# Patient Record
Sex: Male | Born: 1940 | Race: White | Hispanic: No | Marital: Married | Smoking: Former smoker
Health system: Southern US, Community
[De-identification: ages and names within clinical notes are randomized; demographics above are authoritative.]

## PROBLEM LIST (undated history)

## (undated) DIAGNOSIS — M1990 Unspecified osteoarthritis, unspecified site: Secondary | ICD-10-CM

## (undated) DIAGNOSIS — I6529 Occlusion and stenosis of unspecified carotid artery: Secondary | ICD-10-CM

## (undated) DIAGNOSIS — J449 Chronic obstructive pulmonary disease, unspecified: Secondary | ICD-10-CM

## (undated) DIAGNOSIS — I10 Essential (primary) hypertension: Secondary | ICD-10-CM

## (undated) DIAGNOSIS — I509 Heart failure, unspecified: Secondary | ICD-10-CM

## (undated) DIAGNOSIS — N200 Calculus of kidney: Secondary | ICD-10-CM

## (undated) HISTORY — DX: Unspecified osteoarthritis, unspecified site: M19.90

## (undated) HISTORY — PX: EYE SURGERY: SHX253

## (undated) HISTORY — DX: Occlusion and stenosis of unspecified carotid artery: I65.29

## (undated) HISTORY — DX: Calculus of kidney: N20.0

## (undated) HISTORY — PX: KIDNEY STONE SURGERY: SHX686

## (undated) HISTORY — DX: Essential (primary) hypertension: I10

## (undated) HISTORY — DX: Heart failure, unspecified: I50.9

---

## 1997-03-15 HISTORY — PX: CHOLECYSTECTOMY: SHX55

## 2010-08-08 HISTORY — PX: ROTATOR CUFF REPAIR: SHX139

## 2011-02-11 ENCOUNTER — Encounter (INDEPENDENT_AMBULATORY_CARE_PROVIDER_SITE_OTHER): Payer: Medicare PPO | Admitting: Vascular Surgery

## 2011-02-11 DIAGNOSIS — I6529 Occlusion and stenosis of unspecified carotid artery: Secondary | ICD-10-CM

## 2011-02-12 NOTE — Consult Note (Signed)
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  He is a very pleasant 70 year old gentleman who noted tingling in both hands and this is relatively constant.  He does not have any other neurologic deficits specifically no amaurosis fugax, transient ischemic attack or strokes.  He does not have any history of heart disease.  He underwent a carotid duplex evaluation and this revealed moderate to severe left carotid stenosis and no significant right carotid stenosis.  I am seeing him for further discussion.  He does have a history of insulin dependent diabetes and hypertension.  PAST SURGICAL HISTORY:  Significant for cholecystectomy, cataract surgery, rotator cuff surgery and kidney stone extraction.  SOCIAL HISTORY:  He is married with 1 child.  He  does not smoke, having quit in 1963.  He does not drink alcohol on a regular basis.  FAMILY HISTORY:  Significant for hypertension in his parents, congestive heart failure in a brother.  REVIEW OF SYSTEMS:  No weight loss or gain.  He weighs 200 pounds.  He is 5 feet 8 inches tall.  He does have pain over his pretibial area with walking.  CARDIAC:  Negative. GI:  Negative. NEUROLOGIC:  Negative. PULMONARY:  Positive for productive cough. GENITOURINARY:  Positive for  kidney stones. HEMATOLOGIC:  Negative. ENT:  Negative. MUSCULOSKELETAL:  Positive for arthritis, joint pain and muscle pain. PSYCHIATRIC:  Negative. SKIN:  Negative.  PHYSICAL EXAMINATION:  General:  Well developed, well nourished  white male appearing stated age of 79 in no acute distress.  Blood pressure is 165/85, pulse 81, respirations 18.  HEENT:  Normal.  Chest:  Clear bilateral without rales, rhonchi or wheezes.  Heart:  Regular rate and rhythm without murmur.  Abdomen:   Soft, nontender, moderately obese. Musculoskeletal:  Shows no major deformity or cyanosis.  Neurologic:  No focal weakness or  paresthesias.  Skin:  Without ulcers or rashes.  His carotid arteries do not have bruits bilaterally.  He has 2+ radial and 2+ posterior pulses bilaterally.  I do have his review from [facility] suggesting left internal carotid in the 50% to 69% stenosis range.  I discussed this at length with the patient and his wife present.  I have explained symptoms of carotid disease.  He knows to notify us should this occur, otherwise, we will see him in 6 months with repeat carotid duplex.    Larina Earthly, M.D. Electronically Signed  TFE/MEDQ  D:  02/11/2011  T:  02/12/2011  Job:  [id number]  cc:   Lia Hopping

## 2011-08-11 ENCOUNTER — Encounter: Payer: Self-pay | Admitting: Vascular Surgery

## 2011-08-12 ENCOUNTER — Ambulatory Visit (INDEPENDENT_AMBULATORY_CARE_PROVIDER_SITE_OTHER): Payer: Medicare PPO | Admitting: Vascular Surgery

## 2011-08-12 ENCOUNTER — Ambulatory Visit (INDEPENDENT_AMBULATORY_CARE_PROVIDER_SITE_OTHER): Payer: Medicare PPO

## 2011-08-12 ENCOUNTER — Encounter: Payer: Self-pay | Admitting: Vascular Surgery

## 2011-08-12 VITALS — BP 138/85 | HR 68 | Temp 97.8°F | Wt 203.0 lb

## 2011-08-12 DIAGNOSIS — I6529 Occlusion and stenosis of unspecified carotid artery: Secondary | ICD-10-CM

## 2011-08-19 NOTE — Procedures (Unsigned)
CAROTID DUPLEX EXAM  INDICATION:  Carotid stenosis.  HISTORY: Diabetes:  Yes. Cardiac:  No. Hypertension:  Yes. Smoking:  Quit in 1964. Previous Surgery:  No. CV History:  Asymptomatic. Amaurosis Fugax No, Paresthesias No, Hemiparesis No.                                      RIGHT             LEFT Brachial systolic pressure:         144               140 Brachial Doppler waveforms:         Normal            Normal Vertebral direction of flow:        Antegrade         Antegrade DUPLEX VELOCITIES (cm/sec) CCA peak systolic                   59                65 ECA peak systolic                   79                64 ICA peak systolic                   71                110 ICA end diastolic                   30                42 PLAQUE MORPHOLOGY:                  Mixed             Mixed PLAQUE AMOUNT:                      Minimal           Minimal PLAQUE LOCATION:                    Bifurcation       CCA, ICA, ECA  IMPRESSION: 1. Right internal carotid artery velocities suggest 1% to 39%     stenosis. 2. Left internal carotid artery velocities suggest 40% to 59%     stenosis. 3. Antegrade vertebral arteries bilaterally.  ___________________________________________ Larina Earthly, M.D.  EM/MEDQ  D:  08/12/2011  T:  08/12/2011  Job:  [id number]

## 2011-10-20 DIAGNOSIS — I6529 Occlusion and stenosis of unspecified carotid artery: Secondary | ICD-10-CM | POA: Insufficient documentation

## 2011-12-23 ENCOUNTER — Encounter: Payer: Self-pay | Admitting: Vascular Surgery

## 2011-12-23 NOTE — Progress Notes (Signed)
The patient presents on 08/12/2011 for evaluation of asymptomatic carotid disease. He had initially had a carotid duplex at [facility] ingesting a 50-70% left internal carotid artery stenosis. He had no symptoms related to carotid disease. He is seen today for followup. He does have history of diabetes and hypertension quit smoking in 1964 does not have any amaurosis fugax transient ischemic attack or stroke. Past Medical History  Diagnosis Date  . Diabetes mellitus   . Hypertension   . Kidney stone   . Arthritis   . Carotid artery occlusion     History  Substance Use Topics  . Smoking status: Former Smoker    Quit date: 12/15/1961  . Smokeless tobacco: Not on file  . Alcohol Use: No    Family History  Problem Relation Age of Onset  . Hypertension Mother   . Heart disease Father   . Hypertension Father   . Heart disease Brother     No Known Allergies  Current outpatient prescriptions:cholecalciferol (VITAMIN D) 1000 UNITS tablet, Take 1,000 Units by mouth daily. 2 tablets daily  , Disp: , Rfl: ;  doxazosin (CARDURA) 2 MG tablet, Take 4 mg by mouth at bedtime.  , Disp: , Rfl: ;  fish oil-omega-3 fatty acids 1000 MG capsule, Take 1,000 mg by mouth daily.  , Disp: , Rfl: ;  glimepiride (AMARYL) 2 MG tablet, Take 2 mg by mouth daily before breakfast. 2 tablets daily , Disp: , Rfl:  hydrochlorothiazide (,MICROZIDE/HYDRODIURIL,) 12.5 MG capsule, Take 12.5 mg by mouth daily.  , Disp: , Rfl: ;  insulin glargine (LANTUS) 100 UNIT/ML injection, Inject 30 Units into the skin at bedtime.  , Disp: , Rfl: ;  quinapril (ACCUPRIL) 20 MG tablet, Take 20 mg by mouth at bedtime. 2 tablets daily , Disp: , Rfl: ;  vitamin B-12 (CYANOCOBALAMIN) 1000 MCG tablet, Take 1,000 mcg by mouth daily.  , Disp: , Rfl:   BP 138/85  Pulse 68  Temp(Src) 97.8 F (36.6 C) (Oral)  Wt 203 lb (92.08 kg)  There is no height on file to calculate BMI.       Physical exam well-developed gentleman in no acute  distress HEENT is normal he has no focal neurologic deficits with equal strength bilaterally heart regular rate and rhythm carotid arteries without bruits bilaterally  Carotid duplex reveals no significant stenosis in the right carotid. He does have a 40-59% left carotid stenosis.   Impression and plan: Asymptomatic carotid stenosis left internal carotid artery. The patient will notify should they develop any neurologic deficits. Otherwise will be seen again in one year with carotid duplex followup

## 2012-03-30 ENCOUNTER — Encounter: Payer: Self-pay | Admitting: Cardiology

## 2012-03-30 DIAGNOSIS — I509 Heart failure, unspecified: Secondary | ICD-10-CM

## 2012-03-30 HISTORY — DX: Heart failure, unspecified: I50.9

## 2012-05-24 DIAGNOSIS — M1990 Unspecified osteoarthritis, unspecified site: Secondary | ICD-10-CM

## 2012-05-24 HISTORY — DX: Unspecified osteoarthritis, unspecified site: M19.90

## 2012-05-31 ENCOUNTER — Encounter: Payer: Self-pay | Admitting: Cardiology

## 2012-08-06 ENCOUNTER — Other Ambulatory Visit: Payer: Self-pay | Admitting: *Deleted

## 2012-08-06 DIAGNOSIS — I6529 Occlusion and stenosis of unspecified carotid artery: Secondary | ICD-10-CM

## 2012-08-11 ENCOUNTER — Encounter: Payer: Self-pay | Admitting: Neurosurgery

## 2012-08-12 ENCOUNTER — Other Ambulatory Visit (INDEPENDENT_AMBULATORY_CARE_PROVIDER_SITE_OTHER): Payer: Medicare PPO | Admitting: *Deleted

## 2012-08-12 ENCOUNTER — Ambulatory Visit (INDEPENDENT_AMBULATORY_CARE_PROVIDER_SITE_OTHER): Payer: Medicare PPO | Admitting: Neurosurgery

## 2012-08-12 ENCOUNTER — Encounter: Payer: Self-pay | Admitting: Neurosurgery

## 2012-08-12 VITALS — BP 143/83 | HR 49 | Resp 16 | Ht 68.0 in | Wt 190.0 lb

## 2012-08-12 DIAGNOSIS — I6529 Occlusion and stenosis of unspecified carotid artery: Secondary | ICD-10-CM

## 2012-08-12 NOTE — Progress Notes (Signed)
[facility] Carotid Office Note  CC: Annual carotid surveillance Referring Physician: Early  History of Present Illness: 71 year old male patient of Dr. Arbie Cookey followed for known carotid stenosis. The patient denies signs or symptoms of CVA, TIA, amaurosis fugax or any neural deficit. The patient has had some recent medical problems but denies any new medical diagnoses or recent surgery.  Past Medical History  Diagnosis Date  . Diabetes mellitus   . Hypertension   . Kidney stone   . Carotid artery occlusion   . CHF (congestive heart failure) March 30, 2012    Hospital stay 4/16-4/19/2013  . Arthritis 05/24/12    Bilateral hands    ROS: [x]  Positive   [ ]  Denies    General: [ ]  Weight loss, [ ]  Fever, [ ]  chills Neurologic: [ ]  Dizziness, [ ]  Blackouts, [ ]  Seizure [ ]  Stroke, [ ]  "Mini stroke", [ ]  Slurred speech, [ ]  Temporary blindness; [ ]  weakness in arms or legs, [ ]  Hoarseness Cardiac: [ ]  Chest pain/pressure, [ ]  Shortness of breath at rest [ ]  Shortness of breath with exertion, [ ]  Atrial fibrillation or irregular heartbeat Vascular: [ ]  Pain in legs with walking, [ ]  Pain in legs at rest, [ ]  Pain in legs at night,  [ ]  Non-healing ulcer, [ ]  Blood clot in vein/DVT,   Pulmonary: [ ]  Home oxygen, [ ]  Productive cough, [ ]  Coughing up blood, [ ]  Asthma,  [ ]  Wheezing Musculoskeletal:  [ ]  Arthritis, [ ]  Low back pain, [ ]  Joint pain Hematologic: [ ]  Easy Bruising, [ ]  Anemia; [ ]  Hepatitis Gastrointestinal: [ ]  Blood in stool, [ ]  Gastroesophageal Reflux/heartburn, [ ]  Trouble swallowing Urinary: [ ]  chronic Kidney disease, [ ]  on HD - [ ]  MWF or [ ]  TTHS, [ ]  Burning with urination, [ ]  Difficulty urinating Skin: [ ]  Rashes, [ ]  Wounds Psychological: [ ]  Anxiety, [ ]  Depression   Social History History  Substance Use Topics  . Smoking status: Former Smoker    Quit date: 12/15/1961  . Smokeless tobacco: Not on file  . Alcohol Use: No     Family History Family History  Problem Relation Age of Onset  . Hypertension Mother   . Heart disease Father   . Hypertension Father   . Heart disease Brother     No Known Allergies  Current Outpatient Prescriptions  Medication Sig Dispense Refill  . amLODipine-benazepril (LOTREL) 10-20 MG per capsule daily.      . carvedilol (COREG) 12.5 MG tablet daily.      . cholecalciferol (VITAMIN D) 1000 UNITS tablet Take 1,000 Units by mouth daily. 2 tablets daily        . fish oil-omega-3 fatty acids 1000 MG capsule Take 1,000 mg by mouth daily.        . folic acid (FOLVITE) 1 MG tablet daily.      Marland Kitchen glimepiride (AMARYL) 2 MG tablet Take 2 mg by mouth daily before breakfast. 2 tablets daily       . insulin glargine (LANTUS) 100 UNIT/ML injection Inject 30 Units into the skin at bedtime.        . isosorbide mononitrate (IMDUR) 30 MG 24 hr tablet daily.      . predniSONE (DELTASONE) 10 MG tablet daily.      Marland Kitchen RHEUMATREX 2.5 MG tablet daily.      Marland Kitchen torsemide (DEMADEX) 20 MG tablet Twice daily.      Marland Kitchen  vitamin B-12 (CYANOCOBALAMIN) 1000 MCG tablet Take 500 mcg by mouth daily.       Marland Kitchen doxazosin (CARDURA) 2 MG tablet Take 4 mg by mouth at bedtime.        . hydrochlorothiazide (,MICROZIDE/HYDRODIURIL,) 12.5 MG capsule Take 12.5 mg by mouth daily.        . methotrexate (RHEUMATREX) 2.5 MG tablet Once a week.      . quinapril (ACCUPRIL) 20 MG tablet Take 20 mg by mouth at bedtime. 2 tablets daily         Physical Examination  Filed Vitals:   08/12/12 1047  BP: 143/83  Pulse: 49  Resp: 16    Body mass index is 28.89 kg/(m^2).  General:  WDWN in NAD Gait: Normal HEENT: WNL Eyes: Pupils equal Pulmonary: normal non-labored breathing , without Rales, rhonchi,  wheezing Cardiac: RRR, without  Murmurs, rubs or gallops; Abdomen: soft, NT, no masses Skin: no rashes, ulcers noted  Vascular Exam Pulses: 3+ radial pulses bilaterally Carotid bruits: Carotid pulses to auscultation no  bruits are heard Extremities without ischemic changes, no Gangrene , no cellulitis; no open wounds;  Musculoskeletal: no muscle wasting or atrophy   Neurologic: A&O X 3; Appropriate Affect ; SENSATION: normal; MOTOR FUNCTION:  moving all extremities equally. Speech is fluent/normal  Non-Invasive Vascular Imaging CAROTID DUPLEX 08/12/2012  Right ICA 20 - 39 % stenosis Left ICA 40 - 59 % stenosis   ASSESSMENT/PLAN: Asymptomatic patient with unchanged exam from previous, the patient will followup here in one year with a carotid duplex. The patient's questions were encouraged and answered, he is in agreement with this plan.  Lauree Chandler ANP   Clinic MD: Darrick Penna

## 2012-08-12 NOTE — Addendum Note (Signed)
Addended by: Sharee Pimple on: 08/12/2012 12:57 PM   Modules accepted: Orders

## 2013-08-11 ENCOUNTER — Other Ambulatory Visit (INDEPENDENT_AMBULATORY_CARE_PROVIDER_SITE_OTHER): Payer: Medicare PPO | Admitting: *Deleted

## 2013-08-11 ENCOUNTER — Ambulatory Visit: Payer: Medicare PPO | Admitting: Family

## 2013-08-11 DIAGNOSIS — I6529 Occlusion and stenosis of unspecified carotid artery: Secondary | ICD-10-CM

## 2013-08-12 ENCOUNTER — Other Ambulatory Visit: Payer: Self-pay | Admitting: *Deleted

## 2013-08-18 ENCOUNTER — Encounter: Payer: Self-pay | Admitting: Vascular Surgery

## 2014-02-03 ENCOUNTER — Other Ambulatory Visit: Payer: Self-pay | Admitting: Vascular Surgery

## 2014-02-03 DIAGNOSIS — I6529 Occlusion and stenosis of unspecified carotid artery: Secondary | ICD-10-CM

## 2014-03-21 HISTORY — PX: COLONOSCOPY: SHX174

## 2014-03-21 HISTORY — PX: POLYPECTOMY: SHX149

## 2014-05-05 ENCOUNTER — Encounter (INDEPENDENT_AMBULATORY_CARE_PROVIDER_SITE_OTHER): Payer: Self-pay | Admitting: *Deleted

## 2014-06-05 ENCOUNTER — Encounter (INDEPENDENT_AMBULATORY_CARE_PROVIDER_SITE_OTHER): Payer: Self-pay | Admitting: *Deleted

## 2014-06-07 ENCOUNTER — Ambulatory Visit (INDEPENDENT_AMBULATORY_CARE_PROVIDER_SITE_OTHER): Payer: Medicare PPO | Admitting: Internal Medicine

## 2014-06-07 ENCOUNTER — Encounter (INDEPENDENT_AMBULATORY_CARE_PROVIDER_SITE_OTHER): Payer: Self-pay | Admitting: Internal Medicine

## 2014-06-07 VITALS — BP 138/66 | HR 60 | Temp 98.0°F | Ht 68.0 in | Wt 183.4 lb

## 2014-06-07 DIAGNOSIS — Z860100 Personal history of colon polyps, unspecified: Secondary | ICD-10-CM | POA: Insufficient documentation

## 2014-06-07 DIAGNOSIS — I509 Heart failure, unspecified: Secondary | ICD-10-CM | POA: Insufficient documentation

## 2014-06-07 DIAGNOSIS — I10 Essential (primary) hypertension: Secondary | ICD-10-CM

## 2014-06-07 DIAGNOSIS — N200 Calculus of kidney: Secondary | ICD-10-CM

## 2014-06-07 NOTE — Progress Notes (Signed)
Subjective:     Patient ID: Peter Collier, male   DOB: Jul 31, 1941, 73 y.o.   MRN: [id number]  HPI Referred to our office by Dr.[name] for high screening colonoscopy.  Wife tells me this is really a second opinion visit. Patient has undergone 2 coloscopy  He underwent a colonoscopy in December 2014 and April of 2014. He underwent the colonoscopy in December for blood in his stool. He was found to have numerous polyps. Impression:Predunculated polyp at the ileocecal valve. Sessile polyp. In 1-2 months repeat colonoscopy. Ileocecal valve: submucosal lipoma. Hepatic flexure: Tubular adenoma. Transverse: Tubular adenoma, Rectum Hyperplastic polyp. A sessile polyp was found in the TC, large, bx taken and area across was tattooed for future removal. Did not remove at this time secondary to concern for small defect at previous polypectomy site.  Patient underwent a repeat colonoscopy in April  To remove large polyp in the transverse colon.  Lateral parts of polyp raised but the medial part did not. Most of the polyp was removed with a snare, but not able to remove medial section as it was adherant to submucosal. Biopsy:early tubular adenoma. Patient was advised by Dr. Britta Mccreedy he could either have surgery to remove the polyp or maybe get a second opinion.  Appetite is good.  No weight loss.  No abdominal pain. Usually has a BM in am. Denies seeing any BRRB or melena.   Review of Systems Past Medical History  Diagnosis Date  . Diabetes mellitus     x 25 yrs  . Hypertension   . Kidney stone   . Carotid artery occlusion   . CHF (congestive heart failure) March 30, 2012    Hospital stay 4/16-4/19/2013  . Arthritis 05/24/12    Bilateral hands. Also has RA    Past Surgical History  Procedure Laterality Date  . Cholecystectomy  03/1997  . Eye surgery  cataract    07/2007  . Rotator cuff repair  08/08/2010  . Kidney stone surgery    . Colonoscopy  4.7.15    Dr. Britta Mccreedy  . Polypectomy  03/21/14    Sessile  --Dr. Britta Mccreedy     No Known Allergies  Current Outpatient Prescriptions on File Prior to Visit  Medication Sig Dispense Refill  . amLODipine-benazepril (LOTREL) 10-20 MG per capsule Take 1 capsule by mouth daily.       . carvedilol (COREG) 12.5 MG tablet daily.      . cholecalciferol (VITAMIN D) 1000 UNITS tablet Take 1,000 Units by mouth daily. 2 tablets daily        . fish oil-omega-3 fatty acids 1000 MG capsule Take 1,000 mg by mouth daily.        . folic acid (FOLVITE) 1 MG tablet Take 1 mg by mouth daily.       Marland Kitchen glimepiride (AMARYL) 2 MG tablet Take 2 mg by mouth daily before breakfast. 2 tablets daily      . hydroxychloroquine (PLAQUENIL) 200 MG tablet Take 200 mg by mouth 2 (two) times daily.      . insulin glargine (LANTUS) 100 UNIT/ML injection Inject 30 Units into the skin at bedtime.       . isosorbide mononitrate (IMDUR) 30 MG 24 hr tablet Take 30 mg by mouth daily.       . methotrexate (RHEUMATREX) 2.5 MG tablet Take 10 mg by mouth once a week.       . torsemide (DEMADEX) 20 MG tablet Take 20 mg by mouth once.       Marland Kitchen  vitamin B-12 (CYANOCOBALAMIN) 1000 MCG tablet Take 500 mcg by mouth daily.       . traMADol (ULTRAM) 50 MG tablet Take 50 mg by mouth 3 (three) times daily.       No current facility-administered medications on file prior to visit.        Objective:   Physical Exam  Filed Vitals:   06/07/14 1105  BP: 138/66  Pulse: 60  Temp: 98 F (36.7 C)  Height: 5\' 8"  (1.727 m)  Weight: 183 lb 6.4 oz (83.19 kg)   Alert and oriented. Skin warm and dry. Oral mucosa is moist.   . Sclera anicteric, conjunctivae is pink. Thyroid not enlarged. No cervical lymphadenopathy. Lungs clear. Heart regular rate and rhythm.  Abdomen is soft. Bowel sounds are positive. No hepatomegaly. No abdominal masses felt. No tenderness.  No edema to lower extremities.       Assessment:    Hx of colon polyp. Large polyp that Dr. Britta Mccreedy was not completely removed by Dr. Britta Mccreedy. I discussed  this case with Dr. Laural Golden.     Plan:    Will get endo report from [facility] with colored pictures of polyp site.  Further recommendations to follow.

## 2014-06-07 NOTE — Patient Instructions (Signed)
Will discuss this case with Dr. Laural Golden. I will call back this afternoon

## 2014-06-13 ENCOUNTER — Telehealth (INDEPENDENT_AMBULATORY_CARE_PROVIDER_SITE_OTHER): Payer: Self-pay | Admitting: Internal Medicine

## 2014-06-13 NOTE — Telephone Encounter (Signed)
I have spoken with patient.  Peter Collier, needs colonoscopy with polypectomy

## 2014-06-20 ENCOUNTER — Other Ambulatory Visit (INDEPENDENT_AMBULATORY_CARE_PROVIDER_SITE_OTHER): Payer: Self-pay | Admitting: *Deleted

## 2014-06-20 ENCOUNTER — Telehealth (INDEPENDENT_AMBULATORY_CARE_PROVIDER_SITE_OTHER): Payer: Self-pay | Admitting: *Deleted

## 2014-06-20 DIAGNOSIS — Z1211 Encounter for screening for malignant neoplasm of colon: Secondary | ICD-10-CM

## 2014-06-20 DIAGNOSIS — Z86010 Personal history of colonic polyps: Secondary | ICD-10-CM

## 2014-06-20 MED ORDER — PEG-KCL-NACL-NASULF-NA ASC-C 100 G PO SOLR: 1.0000 | Freq: Once | ORAL | Status: DC

## 2014-06-20 NOTE — Telephone Encounter (Signed)
Patient needs movi prep 

## 2014-06-20 NOTE — Telephone Encounter (Signed)
TCS sch'd 07/26/14, patient aware

## 2014-07-17 ENCOUNTER — Encounter (HOSPITAL_COMMUNITY): Payer: Self-pay | Admitting: Pharmacy Technician

## 2014-07-26 ENCOUNTER — Encounter (HOSPITAL_COMMUNITY): Payer: Self-pay | Admitting: *Deleted

## 2014-07-26 ENCOUNTER — Encounter (HOSPITAL_COMMUNITY)
Admission: RE | Disposition: A | Discharge status: Home / Self Care | Payer: Self-pay | Source: Ambulatory Visit | Attending: Internal Medicine

## 2014-07-26 ENCOUNTER — Ambulatory Visit (HOSPITAL_COMMUNITY)
Admission: RE | Admit: 2014-07-26 | Discharge: 2014-07-26 | Disposition: A | Discharge status: Home / Self Care | Payer: Medicare PPO | Source: Ambulatory Visit | Attending: Internal Medicine | Admitting: Internal Medicine

## 2014-07-26 DIAGNOSIS — K5730 Diverticulosis of large intestine without perforation or abscess without bleeding: Secondary | ICD-10-CM | POA: Diagnosis not present

## 2014-07-26 DIAGNOSIS — Z860100 Personal history of colon polyps, unspecified: Secondary | ICD-10-CM | POA: Diagnosis not present

## 2014-07-26 DIAGNOSIS — I10 Essential (primary) hypertension: Secondary | ICD-10-CM | POA: Insufficient documentation

## 2014-07-26 DIAGNOSIS — K644 Residual hemorrhoidal skin tags: Secondary | ICD-10-CM | POA: Diagnosis not present

## 2014-07-26 DIAGNOSIS — D126 Benign neoplasm of colon, unspecified: Secondary | ICD-10-CM | POA: Diagnosis not present

## 2014-07-26 DIAGNOSIS — Z87891 Personal history of nicotine dependence: Secondary | ICD-10-CM | POA: Insufficient documentation

## 2014-07-26 DIAGNOSIS — Z794 Long term (current) use of insulin: Secondary | ICD-10-CM | POA: Insufficient documentation

## 2014-07-26 DIAGNOSIS — Z79899 Other long term (current) drug therapy: Secondary | ICD-10-CM | POA: Diagnosis not present

## 2014-07-26 DIAGNOSIS — Z7982 Long term (current) use of aspirin: Secondary | ICD-10-CM | POA: Insufficient documentation

## 2014-07-26 DIAGNOSIS — M069 Rheumatoid arthritis, unspecified: Secondary | ICD-10-CM | POA: Insufficient documentation

## 2014-07-26 DIAGNOSIS — E119 Type 2 diabetes mellitus without complications: Secondary | ICD-10-CM | POA: Insufficient documentation

## 2014-07-26 DIAGNOSIS — I509 Heart failure, unspecified: Secondary | ICD-10-CM | POA: Insufficient documentation

## 2014-07-26 HISTORY — PX: COLONOSCOPY: SHX5424

## 2014-07-26 LAB — GLUCOSE, CAPILLARY
Glucose-Capillary: 87 mg/dL (ref 70–99)
Glucose-Capillary: 150 mg/dL — ABNORMAL HIGH (ref 70–99)

## 2014-07-26 SURGERY — COLONOSCOPY
Anesthesia: Moderate Sedation

## 2014-07-26 MED ORDER — DEXTROSE IN LACTATED RINGERS 5 % IV SOLN
Freq: Once | INTRAVENOUS | Status: AC
  Administered 2014-07-26: 11:00:00 via INTRAVENOUS

## 2014-07-26 MED ORDER — MIDAZOLAM HCL 5 MG/5ML IJ SOLN
INTRAMUSCULAR | Status: AC
  Filled 2014-07-26: qty 10

## 2014-07-26 MED ORDER — SIMETHICONE 40 MG/0.6ML PO SUSP
ORAL | Status: AC
  Filled 2014-07-26: qty 0.6

## 2014-07-26 MED ORDER — MEPERIDINE HCL 50 MG/ML IJ SOLN
INTRAMUSCULAR | Status: DC | PRN
  Administered 2014-07-26 (×2): 25 mg via INTRAVENOUS

## 2014-07-26 MED ORDER — SODIUM CHLORIDE 0.9 % IV SOLN: INTRAVENOUS | Status: DC

## 2014-07-26 MED ORDER — MEPERIDINE HCL 50 MG/ML IJ SOLN
INTRAMUSCULAR | Status: AC
  Filled 2014-07-26: qty 1

## 2014-07-26 MED ORDER — MIDAZOLAM HCL 5 MG/5ML IJ SOLN
INTRAMUSCULAR | Status: DC | PRN
  Administered 2014-07-26 (×2): 2 mg via INTRAVENOUS
  Administered 2014-07-26 (×2): 1 mg via INTRAVENOUS

## 2014-07-26 MED ORDER — STERILE WATER FOR IRRIGATION IR SOLN
Status: DC | PRN
  Administered 2014-07-26: 11:00:00

## 2014-07-26 NOTE — Op Note (Signed)
COLONOSCOPY PROCEDURE REPORT  PATIENT:  Peter Collier  MR#:  [id number] Birthdate:  [birth year], 73 y.o., male Endoscopist:  Dr. [name], MD Referred By:  Dr. Velvet Bathe A. Sherrie Sport, MD Procedure Date: 07/26/2014  Procedure:   Colonoscopy with snare polypectomy and APC therapy.  Indications:  Patient is 73 year old Caucasian male who was found to have multiple polyps on screening colonoscopy in April last year. He had another colonoscopy in December and finally 1 in April of this year. These exams were performed by Dr. Sunday Shams of either [facility]. He was able to remove all polyps with exception to one and transverse colon. Part of this polyp was removed but he was not able to do complete polypectomy. Patient opted to have another colonoscopy rather than proceeding with surgery.  Informed Consent:  The procedure and risks were reviewed with the patient and informed consent was obtained.  Medications:  Demerol 50 mg IV Versed 6 mg IV  Description of procedure:  After a digital rectal exam was performed, that colonoscope was advanced from the anus through the rectum and colon to the area of the cecum, ileocecal valve and appendiceal orifice. The cecum was deeply intubated. These structures were well-seen and photographed for the record. From the level of the cecum and ileocecal valve, the scope was slowly and cautiously withdrawn. The mucosal surfaces were carefully surveyed utilizing scope tip to flexion to facilitate fold flattening as needed. The scope was pulled down into the rectum where a thorough exam including retroflexion was performed.  Findings:  Prep excellent with exception of some stool coating the mucosa at cecum and ascending colon. Tattoo noted at transverse colon leading up to the polyp which was across on the tattoo site. This polyp was about 30 mm long and part of the polyp was on the other side of the fold. Biopsy was taken from from either margin and in the  midportion which allowed for snare to be placed. Piecemeal polypectomy was performed. This did little polyp was coagulated with argon plasma coagulator. All of the visible polyp was snared and are coagulated and APC. Few scattered diverticula and sigmoid colon. Normal rectal mucosa. Small hemorrhoids below the dentate line.    Therapeutic/Diagnostic Maneuvers Performed:  See above  Complications:  None  Cecal Withdrawal Time:  33 minutes  Impression:  Examination performed to cecum. Long sessile polyp easily identified at transverse colon located across from tattooed site. Piecemeal polypectomy performed followed by APC ablation. Core biopsy on either margin and in the center facilitated snare polypectomy. Mild sigmoid colon diverticulosis. External hemorrhoids.  Recommendations:  Standard instructions given. No aspirin or NSAIDs for 10 days. I will contact patient with biopsy results and further recommendations. If histology is benign, will return for next exam in 6 months.  REHMAN,NAJEEB U  07/26/2014 11:48 AM  CC: Dr. Neale Burly, MD & Dr. Rayne Du ref. provider found

## 2014-07-26 NOTE — H&P (Addendum)
Peter Collier is an 73 y.o. male.   Chief Complaint: Patient is here for colonoscopy with polypectomy. HPI: This is a 73 year old Caucasian male who underwent screening colonoscopy in April 20 14th removal of multiple polyps. Repeat colonoscopy in December 2014 and more recently in April of this year. After Britta Mccreedy was unable to remove one of the polyps completely. Surgical option was considered alongwith repeat colonoscopy. Patient had decided to come to our office for further management. He denies abdominal pain diarrhea constipation rectal bleeding. Family history is negative for CRC. Patient's first colonoscopy was over 10 years ago and was unremarkable.  Past Medical History  Diagnosis Date  . Diabetes mellitus     x 25 yrs  . Hypertension   . Kidney stone   . Carotid artery occlusion   . CHF (congestive heart failure) March 30, 2012    Hospital stay 4/16-4/19/2013  . Arthritis 05/24/12    Bilateral hands. Also has RA    Past Surgical History  Procedure Laterality Date  . Cholecystectomy  03/1997  . Eye surgery  cataract    07/2007  . Rotator cuff repair  08/08/2010  . Kidney stone surgery    . Colonoscopy  4.7.15    Dr. Britta Mccreedy  . Polypectomy  03/21/14    Sessile --Dr. Britta Mccreedy     Family History  Problem Relation Age of Onset  . Hypertension Mother   . Heart disease Father   . Hypertension Father   . Heart disease Brother   . Colon cancer Neg Hx    Social History:  reports that he quit smoking about 52 years ago. His smoking use included Cigarettes. He has a 13.5 pack-year smoking history. He does not have any smokeless tobacco history on file. He reports that he does not drink alcohol or use illicit drugs.  Allergies: No Known Allergies  Medications Prior to Admission  Medication Sig Dispense Refill  . amLODipine (NORVASC) 2.5 MG tablet Take 2.5 mg by mouth daily.      Marland Kitchen aspirin EC 81 MG tablet Take 81 mg by mouth every other day.      . carvedilol (COREG) 12.5 MG tablet  daily.      . cholecalciferol (VITAMIN D) 1000 UNITS tablet Take 1,000 Units by mouth daily.       . fish oil-omega-3 fatty acids 1000 MG capsule Take 1,000 mg by mouth daily.        . folic acid (FOLVITE) 1 MG tablet Take 1 mg by mouth daily.       Marland Kitchen glimepiride (AMARYL) 2 MG tablet Take 4 mg by mouth daily before breakfast. 2 tablets daily      . hydroxychloroquine (PLAQUENIL) 200 MG tablet Take 200 mg by mouth 2 (two) times daily.      . insulin glargine (LANTUS) 100 UNIT/ML injection Inject 35 Units into the skin at bedtime.       . isosorbide mononitrate (IMDUR) 30 MG 24 hr tablet Take 30 mg by mouth daily.       . methotrexate (RHEUMATREX) 2.5 MG tablet Take 10 mg by mouth once a week.       . torsemide (DEMADEX) 20 MG tablet Take 40 mg by mouth once.       . vitamin B-12 (CYANOCOBALAMIN) 1000 MCG tablet Take 500 mcg by mouth daily.       . insulin aspart (NOVOLOG) 100 UNIT/ML injection Inject 5 Units into the skin 3 (three) times daily before meals.  Results for orders placed during the hospital encounter of 07/26/14 (from the past 48 hour(s))  GLUCOSE, CAPILLARY     Status: None   Collection Time    07/26/14 10:20 AM      Result Value Ref Range   Glucose-Capillary 87  70 - 99 mg/dL   No results found.  ROS  Height 5\' 7"  (1.702 m), weight 183 lb (83.008 kg). Physical Exam  Constitutional: He appears well-developed and well-nourished.  HENT:  Mouth/Throat: Oropharynx is clear and moist.  Eyes: Conjunctivae are normal. No scleral icterus.  Neck: No thyromegaly present.  Cardiovascular: Normal rate and regular rhythm.   Murmur (faint systolic murmur at LLSB) heard. Respiratory: Effort normal and breath sounds normal.  GI: Soft. He exhibits no distension and no mass. There is no tenderness.  Musculoskeletal: He exhibits no edema.  Lymphadenopathy:    He has no cervical adenopathy.  Neurological: He is alert.  Skin: Skin is warm and dry.      Assessment/Plan History of multiple colonic adenomas. Colonoscopy with removal of residual polyp.  Sereen Schaff U 07/26/2014, 10:43 AM

## 2014-07-26 NOTE — Discharge Instructions (Signed)
No aspirin or other NSAIDs for 10 days. Resume other medications as before. Resume usual diet. No driving for 24 hours. Physician will call with biopsy results.  Colonoscopy, Care After Refer to this sheet in the next few weeks. These instructions provide you with information on caring for yourself after your procedure. Your health care provider may also give you more specific instructions. Your treatment has been planned according to current medical practices, but problems sometimes occur. Call your health care provider if you have any problems or questions after your procedure. WHAT TO EXPECT AFTER THE PROCEDURE  After your procedure, it is typical to have the following:  A small amount of blood in your stool.  Moderate amounts of gas and mild abdominal cramping or bloating. HOME CARE INSTRUCTIONS  Do not drive, operate machinery, or sign important documents for 24 hours.  You may shower and resume your regular physical activities, but move at a slower pace for the first 24 hours.  Take frequent rest periods for the first 24 hours.  Walk around or put a warm pack on your abdomen to help reduce abdominal cramping and bloating.  Drink enough fluids to keep your urine clear or pale yellow.  You may resume your normal diet as instructed by your health care provider. Avoid heavy or fried foods that are hard to digest.  Avoid drinking alcohol for 24 hours or as instructed by your health care provider.  Only take over-the-counter or prescription medicines as directed by your health care provider.  If a tissue sample (biopsy) was taken during your procedure:  Do not take aspirin or blood thinners for 7 days, or as instructed by your health care provider.  Do not drink alcohol for 7 days, or as instructed by your health care provider.  Eat soft foods for the first 24 hours. SEEK MEDICAL CARE IF: You have persistent spotting of blood in your stool 2-3 days after the procedure. SEEK  IMMEDIATE MEDICAL CARE IF:  You have more than a small spotting of blood in your stool.  You pass large blood clots in your stool.  Your abdomen is swollen (distended).  You have nausea or vomiting.  You have a fever.  You have increasing abdominal pain that is not relieved with medicine.  Colon Polyps Polyps are lumps of extra tissue growing inside the body. Polyps can grow in the large intestine (colon). Most colon polyps are noncancerous (benign). However, some colon polyps can become cancerous over time. Polyps that are larger than a pea may be harmful. To be safe, caregivers remove and test all polyps. CAUSES  Polyps form when mutations in the genes cause your cells to grow and divide even though no more tissue is needed. RISK FACTORS There are a number of risk factors that can increase your chances of getting colon polyps. They include:  Being older than 50 years.  Family history of colon polyps or colon cancer.  Long-term colon diseases, such as colitis or Crohn disease.  Being overweight.  Smoking.  Being inactive.  Drinking too much alcohol. SYMPTOMS  Most small polyps do not cause symptoms. If symptoms are present, they may include:  Blood in the stool. The stool may look dark red or black.  Constipation or diarrhea that lasts longer than 1 week. DIAGNOSIS People often do not know they have polyps until their caregiver finds them during a regular checkup. Your caregiver can use 4 tests to check for polyps:  Digital rectal exam. The caregiver wears  gloves and feels inside the rectum. This test would find polyps only in the rectum.  Barium enema. The caregiver puts a liquid called barium into your rectum before taking X-rays of your colon. Barium makes your colon look white. Polyps are dark, so they are easy to see in the X-ray pictures.  Sigmoidoscopy. A thin, flexible tube (sigmoidoscope) is placed into your rectum. The sigmoidoscope has a light and tiny  camera in it. The caregiver uses the sigmoidoscope to look at the last third of your colon.  Colonoscopy. This test is like sigmoidoscopy, but the caregiver looks at the entire colon. This is the most common method for finding and removing polyps. TREATMENT  Any polyps will be removed during a sigmoidoscopy or colonoscopy. The polyps are then tested for cancer. PREVENTION  To help lower your risk of getting more colon polyps:  Eat plenty of fruits and vegetables. Avoid eating fatty foods.  Do not smoke.  Avoid drinking alcohol.  Exercise every day.  Lose weight if recommended by your caregiver.  Eat plenty of calcium and folate. Foods that are rich in calcium include milk, cheese, and broccoli. Foods that are rich in folate include chickpeas, kidney beans, and spinach. HOME CARE INSTRUCTIONS Keep all follow-up appointments as directed by your caregiver. You may need periodic exams to check for polyps. SEEK MEDICAL CARE IF: You notice bleeding during a bowel movement.

## 2014-08-02 ENCOUNTER — Encounter (INDEPENDENT_AMBULATORY_CARE_PROVIDER_SITE_OTHER): Payer: Self-pay | Admitting: *Deleted

## 2014-08-04 ENCOUNTER — Encounter (HOSPITAL_COMMUNITY): Payer: Self-pay | Admitting: Internal Medicine

## 2014-08-14 ENCOUNTER — Encounter: Payer: Self-pay | Admitting: Family

## 2014-08-15 ENCOUNTER — Other Ambulatory Visit (HOSPITAL_COMMUNITY): Payer: Medicare PPO

## 2014-08-15 ENCOUNTER — Ambulatory Visit: Payer: Medicare PPO | Admitting: Vascular Surgery

## 2014-08-15 ENCOUNTER — Ambulatory Visit (INDEPENDENT_AMBULATORY_CARE_PROVIDER_SITE_OTHER): Payer: Medicare PPO | Admitting: Family

## 2014-08-15 ENCOUNTER — Ambulatory Visit (HOSPITAL_COMMUNITY)
Admission: RE | Admit: 2014-08-15 | Discharge: 2014-08-15 | Disposition: A | Discharge status: Home / Self Care | Payer: Medicare PPO | Source: Ambulatory Visit | Attending: Family | Admitting: Family

## 2014-08-15 ENCOUNTER — Encounter: Payer: Self-pay | Admitting: Family

## 2014-08-15 VITALS — BP 162/101 | HR 61 | Resp 16 | Ht 67.0 in | Wt 192.0 lb

## 2014-08-15 DIAGNOSIS — I658 Occlusion and stenosis of other precerebral arteries: Secondary | ICD-10-CM | POA: Insufficient documentation

## 2014-08-15 DIAGNOSIS — I6529 Occlusion and stenosis of unspecified carotid artery: Secondary | ICD-10-CM

## 2014-08-15 NOTE — Progress Notes (Signed)
Established Carotid Patient   History of Present Illness  Peter Collier is a 73 y.o. male patient of Dr. Donnetta Hutching who has known carotid stenosis. He returns today for follow up.  Patient has not had previous carotid artery intervention.  Patient has Negative history of TIA or stroke symptom.  The patient denies amaurosis fugax or monocular blindness.  The patient  denies facial drooping.  Pt. denies hemiplegia.  The patient denies receptive or expressive aphasia.  Pt. denies extremity weakness.  Pt reports New Medical or Surgical History: series of colonoscopies for polyps  He denies claudication symptoms in legs with walking, denies non healing wounds.  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  Pt Diabetic: Yes, states in better control lately Pt smoker: former smoker, quit in 1963  Pt meds include: Statin : No: wife states his cholesterol is a little elevated, wife is not in favor of pt taking a statin ASA: Yes, every other day Other anticoagulants/antiplatelets: no   Past Medical History  Diagnosis Date  . Diabetes mellitus     x 25 yrs  . Hypertension   . Kidney stone   . Carotid artery occlusion   . CHF (congestive heart failure) March 30, 2012    Hospital stay 4/16-4/19/2013  . Arthritis 05/24/12    Bilateral hands. Also has RA    Social History History  Substance Use Topics  . Smoking status: Former Smoker -- 1.50 packs/day for 9 years    Types: Cigarettes    Quit date: 12/15/1961  . Smokeless tobacco: Never Used  . Alcohol Use: No    Family History Family History  Problem Relation Age of Onset  . Hypertension Mother   . Heart disease Father   . Hypertension Father   . Heart attack Father   . Heart disease Brother   . Colon cancer Neg Hx   . Heart disease Brother     CHF- Before age 52  . Diabetes Brother   . Hypertension Brother     Surgical History Past Surgical History  Procedure Laterality Date  . Cholecystectomy   03/1997  . Eye surgery  cataract    07/2007  . Rotator cuff repair Left 08/08/2010    Shoulder  . Kidney stone surgery    . Colonoscopy  4.7.15    Dr. Britta Mccreedy  . Polypectomy  03/21/14    Sessile --Dr. Britta Mccreedy   . Colonoscopy N/A 07/26/2014    Procedure: COLONOSCOPY;  Surgeon: [name], MD;  [facility];  Service: Endoscopy;  Laterality: N/A;  100-moved to 1115 Ann to notify pt    No Known Allergies  Current Outpatient Prescriptions  Medication Sig Dispense Refill  . amLODipine (NORVASC) 2.5 MG tablet Take 2.5 mg by mouth daily.      . carvedilol (COREG) 12.5 MG tablet daily.      . cholecalciferol (VITAMIN D) 1000 UNITS tablet Take 1,000 Units by mouth daily.       . fish oil-omega-3 fatty acids 1000 MG capsule Take 1,000 mg by mouth daily.        . folic acid (FOLVITE) 1 MG tablet Take 1 mg by mouth daily.       Marland Kitchen glimepiride (AMARYL) 2 MG tablet Take 4 mg by mouth daily before breakfast. 2 tablets daily      . hydroxychloroquine (PLAQUENIL) 200 MG tablet Take 200 mg by mouth 2 (two) times daily.      . insulin  aspart (NOVOLOG) 100 UNIT/ML injection Inject 5 Units into the skin 3 (three) times daily before meals.       . insulin glargine (LANTUS) 100 UNIT/ML injection Inject 35 Units into the skin at bedtime.       . isosorbide mononitrate (IMDUR) 30 MG 24 hr tablet Take 30 mg by mouth daily.       . methotrexate (RHEUMATREX) 2.5 MG tablet Take 10 mg by mouth once a week.       . torsemide (DEMADEX) 20 MG tablet Take 40 mg by mouth 2 (two) times daily.       . vitamin B-12 (CYANOCOBALAMIN) 1000 MCG tablet Take 500 mcg by mouth daily.        No current facility-administered medications for this visit.    Review of Systems : See HPI for pertinent positives and negatives.  Physical Examination  Filed Vitals:   08/15/14 1432 08/15/14 1434  BP: 158/97 162/101  Pulse: 63 61  Resp:  16  Height:  5\' 7"  (1.702 m)  Weight:  192 lb (87.091 kg)  SpO2:  98%   Body mass  index is 30.06 kg/(m^2).  General: WDWN obese male in NAD GAIT: normal Eyes: PERRLA Pulmonary:  Non-labored, CTAB, Negative  Rales, Negative rhonchi, & Negative wheezing.  Cardiac: regular Rhythm ,  Negative detected murmur.  VASCULAR EXAM Carotid Bruits Right Left   Negative Negative    Aorta is not palpable. Radial pulses are 2+ palpable and equal.                                                                                                                            LE Pulses Right Left       POPLITEAL  not palpable  not palpable       POSTERIOR TIBIAL   palpable    palpable        DORSALIS PEDIS      ANTERIOR TIBIAL  palpable   palpable     Gastrointestinal: soft, nontender, BS WNL, no r/g,  negative masses palpated.  Musculoskeletal: Negative muscle atrophy/wasting. M/S 5/5 throughout, Extremities without ischemic changes.  Neurologic: A&O X 3; Appropriate Affect ; SENSATION ;normal;  Speech is normal CN 2-12 intact except some hearing loss, Pain and light touch intact in extremities, Motor exam as listed above.   Non-Invasive Vascular Imaging CAROTID DUPLEX 08/15/2014   CEREBROVASCULAR DUPLEX EVALUATION    INDICATION: Carotid stenosis    PREVIOUS INTERVENTION(S): N/A    DUPLEX EXAM:     RIGHT  LEFT  Peak Systolic Velocities (cm/s) End Diastolic Velocities (cm/s) Plaque LOCATION Peak Systolic Velocities (cm/s) End Diastolic Velocities (cm/s) Plaque  86 11  CCA PROXIMAL 111 18   95 15  CCA MID 86 21   74 14 HT CCA DISTAL 67 16 HT  95 10  ECA 104 11   79 24 HT ICA PROXIMAL 160 55 HT  51 16  ICA MID 107 18  64 19  ICA DISTAL 46 14     0.83 ICA / CCA Ratio (PSV) 1.86  Antegrade Vertebral Flow Antegrade  209 Brachial Systolic Pressure (mmHg) 470  Triphasic Brachial Artery Waveforms Triphasic    Plaque Morphology:  HM = Homogeneous, HT = Heterogeneous, CP = Calcific Plaque, SP = Smooth Plaque, IP = Irregular Plaque     ADDITIONAL FINDINGS:      IMPRESSION: Right internal carotid artery stenosis present in the less than 40% range. Left internal carotid artery stenosis present in the 40%-59% range.    Compared to the previous exam:  Stable on the right and increase on the left since previous study on 08/11/2013.    Assessment: GIOVANI NEUMEISTER is a 73 y.o. male who presents with asymptomatic minimal right ICA stenosis and mild/moderate left ICA stenosis.  Stable on the right and increase on the left since previous study on 08/11/2013. Consider intensive statin therapy which is associated with a greater reduction in CVD risk and improved endothelial function , will defer to PCP.  Plan: Follow-up in 1 year with Carotid Duplex scan.   I discussed in depth with the patient the nature of atherosclerosis, and emphasized the importance of maximal medical management including strict control of blood pressure, blood glucose, and lipid levels, obtaining regular exercise, and continued cessation of smoking.  The patient is aware that without maximal medical management the underlying atherosclerotic disease process will progress, limiting the benefit of any interventions. The patient was given information about stroke prevention and what symptoms should prompt the patient to seek immediate medical care. Thank you for allowing [place] to participate in this patient's care.  Clemon Chambers, RN, MSN, FNP-C [facility] Office: [phone]  Clinic Physician: Early  08/15/2014 2:37 PM

## 2014-08-15 NOTE — Patient Instructions (Signed)
Stroke Prevention Some medical conditions and behaviors are associated with an increased chance of having a stroke. You may prevent a stroke by making healthy choices and managing medical conditions. HOW CAN I REDUCE MY RISK OF HAVING A STROKE?   Stay physically active. Get at least 30 minutes of activity on most or all days.  Do not smoke. It may also be helpful to avoid exposure to secondhand smoke.  Limit alcohol use. Moderate alcohol use is considered to be:  No more than 2 drinks per day for men.  No more than 1 drink per day for nonpregnant women.  Eat healthy foods. This involves:  Eating 5 or more servings of fruits and vegetables a day.  Making dietary changes that address high blood pressure (hypertension), high cholesterol, diabetes, or obesity.  Manage your cholesterol levels.  Making food choices that are high in fiber and low in saturated fat, trans fat, and cholesterol may control cholesterol levels.  Take any prescribed medicines to control cholesterol as directed by your health care provider.  Manage your diabetes.  Controlling your carbohydrate and sugar intake is recommended to manage diabetes.  Take any prescribed medicines to control diabetes as directed by your health care provider.  Control your hypertension.  Making food choices that are low in salt (sodium), saturated fat, trans fat, and cholesterol is recommended to manage hypertension.  Take any prescribed medicines to control hypertension as directed by your health care provider.  Maintain a healthy weight.  Reducing calorie intake and making food choices that are low in sodium, saturated fat, trans fat, and cholesterol are recommended to manage weight.  Stop drug abuse.  Avoid taking birth control pills.  Talk to your health care provider about the risks of taking birth control pills if you are over 35 years old, smoke, get migraines, or have ever had a blood clot.  Get evaluated for sleep  disorders (sleep apnea).  Talk to your health care provider about getting a sleep evaluation if you snore a lot or have excessive sleepiness.  Take medicines only as directed by your health care provider.  For some people, aspirin or blood thinners (anticoagulants) are helpful in reducing the risk of forming abnormal blood clots that can lead to stroke. If you have the irregular heart rhythm of atrial fibrillation, you should be on a blood thinner unless there is a good reason you cannot take them.  Understand all your medicine instructions.  Make sure that other conditions (such as anemia or atherosclerosis) are addressed. SEEK IMMEDIATE MEDICAL CARE IF:   You have sudden weakness or numbness of the face, arm, or leg, especially on one side of the body.  Your face or eyelid droops to one side.  You have sudden confusion.  You have trouble speaking (aphasia) or understanding.  You have sudden trouble seeing in one or both eyes.  You have sudden trouble walking.  You have dizziness.  You have a loss of balance or coordination.  You have a sudden, severe headache with no known cause.  You have new chest pain or an irregular heartbeat. Any of these symptoms may represent a serious problem that is an emergency. Do not wait to see if the symptoms will go away. Get medical help at once. Call your local emergency services (911 in [place] Do not drive yourself to the hospital. Document Released: 01/08/2005 Document Revised: 04/17/2014 Document Reviewed: 06/03/2013 Exit[employer] Patient Information 2015 [employer]. This information is not intended to replace advice given   to you by your health care provider. Make sure you discuss any questions you have with your health care provider.  

## 2014-08-16 NOTE — Addendum Note (Signed)
Addended by: Mena Goes on: 08/16/2014 04:50 PM   Modules accepted: Orders

## 2014-08-24 ENCOUNTER — Encounter: Payer: Self-pay | Admitting: Cardiology

## 2014-08-30 ENCOUNTER — Encounter: Payer: Self-pay | Admitting: Cardiology

## 2014-08-30 LAB — PULMONARY FUNCTION TEST

## 2014-12-13 ENCOUNTER — Encounter (INDEPENDENT_AMBULATORY_CARE_PROVIDER_SITE_OTHER): Payer: Self-pay

## 2014-12-26 ENCOUNTER — Other Ambulatory Visit: Payer: Self-pay

## 2014-12-26 DIAGNOSIS — I5022 Chronic systolic (congestive) heart failure: Secondary | ICD-10-CM

## 2014-12-27 ENCOUNTER — Ambulatory Visit (INDEPENDENT_AMBULATORY_CARE_PROVIDER_SITE_OTHER): Payer: Medicare PPO | Admitting: Cardiology

## 2014-12-27 ENCOUNTER — Encounter: Payer: Self-pay | Admitting: Cardiology

## 2014-12-27 VITALS — BP 130/72 | HR 65 | Ht 67.0 in | Wt 187.0 lb

## 2014-12-27 DIAGNOSIS — R0600 Dyspnea, unspecified: Secondary | ICD-10-CM

## 2014-12-27 DIAGNOSIS — I5032 Chronic diastolic (congestive) heart failure: Secondary | ICD-10-CM

## 2014-12-27 DIAGNOSIS — I5020 Unspecified systolic (congestive) heart failure: Secondary | ICD-10-CM

## 2014-12-27 NOTE — Patient Instructions (Signed)
Your physician recommends that you schedule a follow-up appointment to be determined after Dr. Harl Bowie looks over your echo and speaks with your primary  Your physician recommends that you continue on your current medications as directed. Please refer to the Current Medication list given to you today.  Thank you for choosing [facility]!!

## 2014-12-27 NOTE — Progress Notes (Addendum)
Clinical Summary [name] is a 74 y.o.male seen today as a new patient for the following medical problems.  1. SOB - symptoms started approx 1 year ago. DOE at just a few feet. Denies any edema. No orthopnea, no PND - no chest pain, no palpitations - he reports a history of CHF diagnosed April 2013 - on torsemide 40 and 20mg  at night. Reports was on higher doses of diuretic without much difference in symptoms - hx of tobacco x 10 years, quit in mid 59s. Former [employer] logistics/support/administrative officer.  Echo 08/2014 [facility]: severe LVH, LVEF 50-55%, no WMAs, abnormal diastolic function restrictive, mode LAE, mild MR, mild TR, PASP 42 mmHg.  VQ scan 08/2014: no PE   2. Carotid stenosis - followed by vascular  3. Rheumatoid arthritis - followed by Dr Amanda Pea   Past Medical History  Diagnosis Date  . Diabetes mellitus     x 25 yrs  . Hypertension   . Kidney stone   . Carotid artery occlusion   . CHF (congestive heart failure) March 30, 2012    Hospital stay 4/16-4/19/2013  . Arthritis 05/24/12    Bilateral hands. Also has RA     No Known Allergies   Current Outpatient Prescriptions  Medication Sig Dispense Refill  . amLODipine (NORVASC) 2.5 MG tablet Take 2.5 mg by mouth daily.    . carvedilol (COREG) 12.5 MG tablet daily.    . cholecalciferol (VITAMIN D) 1000 UNITS tablet Take 1,000 Units by mouth daily.     . fish oil-omega-3 fatty acids 1000 MG capsule Take 1,000 mg by mouth daily.      . folic acid (FOLVITE) 1 MG tablet Take 1 mg by mouth daily.     Marland Kitchen glimepiride (AMARYL) 2 MG tablet Take 4 mg by mouth daily before breakfast. 2 tablets daily    . hydroxychloroquine (PLAQUENIL) 200 MG tablet Take 200 mg by mouth 2 (two) times daily.    . insulin aspart (NOVOLOG) 100 UNIT/ML injection Inject 5 Units into the skin 3 (three) times daily before meals.     . insulin glargine (LANTUS) 100 UNIT/ML injection Inject 35 Units into the skin at bedtime.     . isosorbide mononitrate (IMDUR) 30 MG 24 hr  tablet Take 30 mg by mouth daily.     . methotrexate (RHEUMATREX) 2.5 MG tablet Take 10 mg by mouth once a week.     . torsemide (DEMADEX) 20 MG tablet Take 40 mg by mouth 2 (two) times daily.     . vitamin B-12 (CYANOCOBALAMIN) 1000 MCG tablet Take 500 mcg by mouth daily.      No current facility-administered medications for this visit.     Past Surgical History  Procedure Laterality Date  . Cholecystectomy  03/1997  . Eye surgery  cataract    07/2007  . Rotator cuff repair Left 08/08/2010    Shoulder  . Kidney stone surgery    . Colonoscopy  4.7.15    Dr. Britta Mccreedy  . Polypectomy  03/21/14    Sessile --Dr. Britta Mccreedy   . Colonoscopy N/A 07/26/2014    Procedure: COLONOSCOPY;  Surgeon: [name], MD;  Location: [facility];  Service: Endoscopy;  Laterality: N/A;  100-moved to 1115 Ann to notify pt     No Known Allergies    Family History  Problem Relation Age of Onset  . Hypertension Mother   . Heart disease Father   . Hypertension Father   . Heart attack  Father   . Heart disease Brother   . Colon cancer Neg Hx   . Heart disease Brother     CHF- Before age 23  . Diabetes Brother   . Hypertension Brother      Social History Mr. Collier reports that he quit smoking about 53 years ago. His smoking use included Cigarettes. He has a 13.5 pack-year smoking history. He has never used smokeless tobacco. [name] reports that he does not drink alcohol.   Review of Systems CONSTITUTIONAL: No weight loss, fever, chills, weakness or fatigue.  HEENT: Eyes: No visual loss, blurred vision, double vision or yellow sclerae.No hearing loss, sneezing, congestion, runny nose or sore throat.  SKIN: No rash or itching.  CARDIOVASCULAR: per HPI RESPIRATORY: No shortness of breath, cough or sputum.  GASTROINTESTINAL: No anorexia, nausea, vomiting or diarrhea. No abdominal pain or blood.  GENITOURINARY: No burning on urination, no polyuria NEUROLOGICAL: No headache, dizziness, syncope,  paralysis, ataxia, numbness or tingling in the extremities. No change in bowel or bladder control.  MUSCULOSKELETAL: No muscle, back pain, joint pain or stiffness.  LYMPHATICS: No enlarged nodes. No history of splenectomy.  PSYCHIATRIC: No history of depression or anxiety.  ENDOCRINOLOGIC: No reports of sweating, cold or heat intolerance. No polyuria or polydipsia.  Marland Kitchen   Physical Examination p 65 bp 130/72 Wt 187 lbs BMI 29 Gen: resting comfortably, no acute distress HEENT: no scleral icterus, pupils equal round and reactive, no palptable cervical adenopathy,  CV: RRR, no m/r/g, no JVD, no carotid bruits Resp: Clear to auscultation bilaterally GI: abdomen is soft, non-tender, non-distended, normal bowel sounds, no hepatosplenomegaly MSK: extremities are warm, no edema.  Skin: warm, no rash Neuro:  no focal deficits Psych: appropriate affect      Assessment and Plan  1. SOB - potentially all secondary to his restrictive diastolic dysfunction, however increased aggression in diuretic therapy has not seemed to improve symptoms. Agree he needs an ischemic evaluation. Patient reports pcp initially looked to arrange cath but was not approved, patient now has a cardiac CT arranged by pcp - do not see strong indication for cath at this point in setting of normal LVEF and isolated diastolic heart failure. I think a functional ischemic study would be better in his evaluation, will cancel cardiac CT and order Lexiscan MPI - pending resuls, may consider CPX or RHC to further delineate any potential cardiac cause for his symptoms - will request echo images from [facility] for independent review.      F/u pending stress test results  Peter Collier, M.D.   Addendum 01/09/15  Echo reviewed from [facility]. LVEF 50-55%, grade II diastolic dysfunction. No severe valvular abnormalities. Calcified AV by not significant dysfunction. Poorly seen RV, appears enlarged with grossly normal function.  Zandra Abts MD

## 2015-01-04 ENCOUNTER — Telehealth: Payer: Self-pay | Admitting: Cardiology

## 2015-01-04 DIAGNOSIS — I5020 Unspecified systolic (congestive) heart failure: Secondary | ICD-10-CM

## 2015-01-04 NOTE — Telephone Encounter (Signed)
Lexiscan scheduled for 01-11-2015 at [facility]. Called patient. Left message asking patient to return call.

## 2015-01-04 NOTE — Telephone Encounter (Signed)
I spoke with Dr Amanda Pea last week and had forwarded a message to get back in touch with patient's family. Patient is supposed to have a lexiscan, the cardiac CT is supposed to be cancelled. Please make sure this is arranged. I have just received echo disc and will review later today.   Zandra Abts MD

## 2015-01-04 NOTE — Telephone Encounter (Signed)
Peter Collier called wanting to know if Dr. Harl Bowie had reviewed the echo (sent from [facility] on disk) and also a telephone call from [place]. Patient Is wanting to know if Dr. Harl Bowie has made any decisions.

## 2015-01-04 NOTE — Telephone Encounter (Signed)
approved

## 2015-01-04 NOTE — Telephone Encounter (Signed)
Lexiscan cardiolite scheduled for 01-11-15 @ [facility] checking percert

## 2015-01-04 NOTE — Telephone Encounter (Signed)
Pt wife updated that Dr. Harl Bowie will review echo from [facility] today and stress test, orders are in for lexiscan and will forward for scheduling and for cardiac CT is CX

## 2015-01-08 ENCOUNTER — Encounter: Payer: Self-pay | Admitting: Cardiology

## 2015-01-08 NOTE — Telephone Encounter (Signed)
Received telephone call from Mr. Alcock in regards to stress testing on Thursday. Wants to know about taking his medications.

## 2015-01-09 ENCOUNTER — Encounter: Payer: Self-pay | Admitting: *Deleted

## 2015-01-09 ENCOUNTER — Encounter (INDEPENDENT_AMBULATORY_CARE_PROVIDER_SITE_OTHER): Payer: Self-pay | Admitting: *Deleted

## 2015-01-09 NOTE — Telephone Encounter (Signed)
Gave pt wife instructions for holding Coreg and Imdur 24 hrs before lexiscan. Pt wife verbalized understanding. Also mailed pt letter of instructions.

## 2015-01-11 ENCOUNTER — Encounter (HOSPITAL_COMMUNITY)
Admission: RE | Admit: 2015-01-11 | Discharge: 2015-01-11 | Disposition: A | Discharge status: Home / Self Care | Payer: Medicare FFS | Source: Ambulatory Visit | Attending: Cardiology | Admitting: Cardiology

## 2015-01-11 ENCOUNTER — Encounter (HOSPITAL_COMMUNITY): Payer: Self-pay

## 2015-01-11 ENCOUNTER — Ambulatory Visit (HOSPITAL_COMMUNITY)
Admission: RE | Admit: 2015-01-11 | Discharge: 2015-01-11 | Disposition: A | Discharge status: Home / Self Care | Payer: Medicare PPO | Source: Ambulatory Visit | Attending: Cardiology | Admitting: Cardiology

## 2015-01-11 DIAGNOSIS — R0602 Shortness of breath: Secondary | ICD-10-CM | POA: Diagnosis not present

## 2015-01-11 DIAGNOSIS — I2510 Atherosclerotic heart disease of native coronary artery without angina pectoris: Secondary | ICD-10-CM | POA: Insufficient documentation

## 2015-01-11 DIAGNOSIS — I509 Heart failure, unspecified: Secondary | ICD-10-CM | POA: Insufficient documentation

## 2015-01-11 DIAGNOSIS — I5020 Unspecified systolic (congestive) heart failure: Secondary | ICD-10-CM

## 2015-01-11 MED ORDER — TECHNETIUM TC 99M SESTAMIBI GENERIC - CARDIOLITE
10.0000 | Freq: Once | INTRAVENOUS | Status: AC | PRN
  Administered 2015-01-11: 10 via INTRAVENOUS

## 2015-01-11 MED ORDER — SODIUM CHLORIDE 0.9 % IJ SOLN
INTRAMUSCULAR | Status: AC
  Administered 2015-01-11: 10 mL via INTRAVENOUS
  Filled 2015-01-11: qty 3

## 2015-01-11 MED ORDER — REGADENOSON 0.4 MG/5ML IV SOLN
INTRAVENOUS | Status: AC
  Administered 2015-01-11: 0.4 mg via INTRAVENOUS
  Filled 2015-01-11: qty 5

## 2015-01-11 MED ORDER — REGADENOSON 0.4 MG/5ML IV SOLN
0.4000 mg | Freq: Once | INTRAVENOUS | Status: AC | PRN
  Administered 2015-01-11: 0.4 mg via INTRAVENOUS

## 2015-01-11 MED ORDER — SODIUM CHLORIDE 0.9 % IJ SOLN
10.0000 mL | INTRAMUSCULAR | Status: DC | PRN
  Administered 2015-01-11: 10 mL via INTRAVENOUS
  Filled 2015-01-11: qty 10

## 2015-01-11 MED ORDER — TECHNETIUM TC 99M SESTAMIBI - CARDIOLITE
30.0000 | Freq: Once | INTRAVENOUS | Status: AC | PRN
  Administered 2015-01-11: 10:00:00 30 via INTRAVENOUS

## 2015-01-11 NOTE — Progress Notes (Signed)
Stress Lab Nurses Notes - [name]  Peter Collier 01/11/2015 Reason for doing test: CHF & SOB Type of test: Wille Glaser Nurse performing test: [name], RN Nuclear Medicine Tech: [name] Echo Tech: Not Applicable MD performing test: Branch/ K.Purcell Nails NP Family MD: [name] Test explained and consent signed: Yes.   IV started: Saline lock flushed, No redness or edema and Saline lock started in radiology Symptoms: tightness in head Treatment/Intervention: None Reason test stopped: protocol completed After recovery IV was: Discontinued via X-ray tech and No redness or edema Patient to return to Nuc. Med at : 10:20 Patient discharged: Home Patient's Condition upon discharge was: stable Comments: During test BP 144/70 & HR 81.  Recovery BP 157/85 & HR 78.  Symptoms resolved in recovery.  [name] T

## 2015-01-12 ENCOUNTER — Telehealth: Payer: Self-pay | Admitting: *Deleted

## 2015-01-12 NOTE — Telephone Encounter (Signed)
Pt wife made aware, forwarded to Dr. Zada Girt. Scheduled f/u appt with Dr. Harl Bowie 02/13/15.

## 2015-01-12 NOTE — Telephone Encounter (Signed)
-----   Message from Arnoldo Lenis, MD sent at 01/11/2015  6:38 PM EST ----- Normal stress test, no evidence of any blockages. Does not appear his SOB is related. F/u 3-4 weeks to discuss in further detail.  Zandra Abts MD

## 2015-01-18 ENCOUNTER — Other Ambulatory Visit (INDEPENDENT_AMBULATORY_CARE_PROVIDER_SITE_OTHER): Payer: Self-pay | Admitting: *Deleted

## 2015-01-18 ENCOUNTER — Telehealth (INDEPENDENT_AMBULATORY_CARE_PROVIDER_SITE_OTHER): Payer: Self-pay | Admitting: *Deleted

## 2015-01-18 DIAGNOSIS — Z1211 Encounter for screening for malignant neoplasm of colon: Secondary | ICD-10-CM

## 2015-01-18 DIAGNOSIS — Z86010 Personal history of colonic polyps: Secondary | ICD-10-CM

## 2015-01-18 MED ORDER — PEG 3350-KCL-NA BICARB-NACL 420 G PO SOLR: 4000.0000 mL | Freq: Once | ORAL | Status: DC

## 2015-01-18 NOTE — Telephone Encounter (Signed)
Patient needs trilyte 

## 2015-02-13 ENCOUNTER — Encounter: Payer: Self-pay | Admitting: Cardiology

## 2015-02-13 ENCOUNTER — Encounter: Payer: Self-pay | Admitting: *Deleted

## 2015-02-13 ENCOUNTER — Ambulatory Visit (INDEPENDENT_AMBULATORY_CARE_PROVIDER_SITE_OTHER): Payer: Medicare FFS | Admitting: Cardiology

## 2015-02-13 ENCOUNTER — Telehealth (INDEPENDENT_AMBULATORY_CARE_PROVIDER_SITE_OTHER): Payer: Self-pay | Admitting: *Deleted

## 2015-02-13 VITALS — BP 134/76 | HR 70 | Ht 67.0 in | Wt 192.1 lb

## 2015-02-13 DIAGNOSIS — R0602 Shortness of breath: Secondary | ICD-10-CM

## 2015-02-13 NOTE — Patient Instructions (Signed)
Your physician wants you to follow-up in: 6 months with Dr. Bryna Colander will receive a reminder letter in the mail two months in advance. If you don't receive a letter, please call our office to schedule the follow-up appointment.  Your physician recommends that you continue on your current medications as directed. Please refer to the Current Medication list given to you today.  Your physician has recommended that you have a pulmonary function test. Pulmonary Function Tests are a group of tests that measure how well air moves in and out of your lungs.  We will request labs from Dr. [name]  Thank you for choosing [facility]!!

## 2015-02-13 NOTE — Progress Notes (Signed)
Clinical Summary Mr. Scheib is a 74 y.o.male seen today for follow up of the following medical problems.   1. SOB - symptoms started approx 1 year ago. DOE at just a few feet. Denies any edema. No orthopnea, no PND - no chest pain, no palpitations   Echo 08/2014 [facility]: severe LVH, LVEF 50-55%, no WMAs, abnormal diastolic function restrictive, mode LAE, mild MR, mild TR, PASP 42 mmHg.  VQ scan 08/2014: no PE 01/2015 Lexiscan MPI without ischemia - hx of tobacco x 10 years, quit in mid 20s. Former [employer] forces logistics/support/administrative officer.     Past Medical History  Diagnosis Date  . Diabetes mellitus     x 25 yrs  . Hypertension   . Kidney stone   . Carotid artery occlusion   . CHF (congestive heart failure) March 30, 2012    Hospital stay 4/16-4/19/2013  . Arthritis 05/24/12    Bilateral hands. Also has RA     No Known Allergies   Current Outpatient Prescriptions  Medication Sig Dispense Refill  . amLODipine (NORVASC) 2.5 MG tablet Take 2.5 mg by mouth daily.    . carvedilol (COREG) 12.5 MG tablet daily.    . cholecalciferol (VITAMIN D) 1000 UNITS tablet Take 1,000 Units by mouth daily.     . fish oil-omega-3 fatty acids 1000 MG capsule Take 1,000 mg by mouth daily.      . folic acid (FOLVITE) 1 MG tablet Take 1 mg by mouth daily.     Marland Kitchen glimepiride (AMARYL) 2 MG tablet Take 4 mg by mouth daily before breakfast. 2 tablets daily    . hydroxychloroquine (PLAQUENIL) 200 MG tablet Take 200 mg by mouth 2 (two) times daily.    . insulin aspart (NOVOLOG) 100 UNIT/ML injection Inject 5 Units into the skin 3 (three) times daily before meals.     . insulin glargine (LANTUS) 100 UNIT/ML injection Inject 35 Units into the skin at bedtime.     . isosorbide mononitrate (IMDUR) 30 MG 24 hr tablet Take 30 mg by mouth daily.     . methotrexate (RHEUMATREX) 2.5 MG tablet Take 10 mg by mouth once a week.     . polyethylene glycol-electrolytes (NULYTELY/GOLYTELY) 420 G solution Take 4,000 mLs by mouth once. 4000 mL  0  . spironolactone (ALDACTONE) 25 MG tablet Take 12.5 mg by mouth daily.    Marland Kitchen torsemide (DEMADEX) 20 MG tablet Take 40 mg by mouth 2 (two) times daily.     . vitamin B-12 (CYANOCOBALAMIN) 1000 MCG tablet Take 500 mcg by mouth daily.      No current facility-administered medications for this visit.     Past Surgical History  Procedure Laterality Date  . Cholecystectomy  03/1997  . Eye surgery  cataract    07/2007  . Rotator cuff repair Left 08/08/2010    Shoulder  . Kidney stone surgery    . Colonoscopy  4.7.15    Dr. Britta Mccreedy  . Polypectomy  03/21/14    Sessile --Dr. Britta Mccreedy   . Colonoscopy N/A 07/26/2014    Procedure: COLONOSCOPY;  Surgeon: [name], MD;  Location: [facility];  Service: Endoscopy;  Laterality: N/A;  100-moved to 1115 Ann to notify pt     No Known Allergies    Family History  Problem Relation Age of Onset  . Hypertension Mother   . Heart disease Father   . Hypertension Father   . Heart attack Father   . Heart disease Brother   .  Colon cancer Neg Hx   . Heart disease Brother     CHF- Before age 18  . Diabetes Brother   . Hypertension Brother      Social History Mr. Poplaski reports that he quit smoking about 53 years ago. His smoking use included Cigarettes. He has a 13.5 pack-year smoking history. He has never used smokeless tobacco. Mr. Ion reports that he does not drink alcohol.   Review of Systems CONSTITUTIONAL: No weight loss, fever, chills, weakness or fatigue.  HEENT: Eyes: No visual loss, blurred vision, double vision or yellow sclerae.No hearing loss, sneezing, congestion, runny nose or sore throat.  SKIN: No rash or itching.  CARDIOVASCULAR: per HPI RESPIRATORY: No cough or sputum.  GASTROINTESTINAL: No anorexia, nausea, vomiting or diarrhea. No abdominal pain or blood.  GENITOURINARY: No burning on urination, no polyuria NEUROLOGICAL: No headache, dizziness, syncope, paralysis, ataxia, numbness or tingling in the extremities. No  change in bowel or bladder control.  MUSCULOSKELETAL: No muscle, back pain, joint pain or stiffness.  LYMPHATICS: No enlarged nodes. No history of splenectomy.  PSYCHIATRIC: No history of depression or anxiety.  ENDOCRINOLOGIC: No reports of sweating, cold or heat intolerance. No polyuria or polydipsia.  Marland Kitchen   Physical Examination p 70 bp 134/76 Wt 192 lbs BMI 30 Gen: resting comfortably, no acute distress HEENT: no scleral icterus, pupils equal round and reactive, no palptable cervical adenopathy,  CV: RRR, no m/r/g, no JVD Resp: Clear to auscultation bilaterally GI: abdomen is soft, non-tender, non-distended, normal bowel sounds, no hepatosplenomegaly MSK: extremities are warm, no edema.  Skin: warm, no rash Neuro:  no focal deficits Psych: appropriate affect   Diagnostic Studies Echo Echo reviewed from [facility]. LVEF 50-55%, grade II diastolic dysfunction. No severe valvular abnormalities. Calcified AV by not significant dysfunction. Poorly seen RV, appears enlarged with grossly normal function.  Jan 2016 Lexiscan MPI IMPRESSION: 1. No reversible ischemia or infarction.  2. Normal left ventricular wall motion.  3. Left ventricular ejection fraction 40%  4. Intermediate-risk stress test findings*. Intermediate risk based on the mildly decreased left ventricular ejection fraction. Recommend correlating with echocardiogram. There is no current myocardium at jeopardy.   Assessment and Plan  1. SOB - echo with normal LVEF, grade II diastolic dysfunction Lexiscan MPI without evidence of ischemia - appears euvolemic, continued symptoms not explained by diastolic HF. Continue current diuretic.  - will refer for PFTs.     F/u 6 months     Arnoldo Lenis, M.D.

## 2015-02-13 NOTE — Telephone Encounter (Signed)
agree

## 2015-02-13 NOTE — Telephone Encounter (Signed)
Referring MD/PCP: hasanaj   Procedure: tcs  Reason/Indication:  Hx polyps  Has patient had this procedure before?  Yes, 07/2014 -- epic  If so, when, by whom and where?    Is there a family history of colon cancer?  no  Who?  What age when diagnosed?    Is patient diabetic?   yes      Does patient have prosthetic heart valve?  no  Do you have a pacemaker?  no  Has patient ever had endocarditis? no  Has patient had joint replacement within last 12 months?  no  Does patient tend to be constipated or take laxatives? no  Is patient on Coumadin, Plavix and/or Aspirin? yes  Medications: see epic  Allergies: nkda  Medication Adjustment: asa 2 days, hold DM meds evening before and morning of  Procedure date & time: 03/14/15 at 1030

## 2015-02-21 ENCOUNTER — Ambulatory Visit (HOSPITAL_COMMUNITY)
Admission: RE | Admit: 2015-02-21 | Discharge: 2015-02-21 | Disposition: A | Discharge status: Home / Self Care | Payer: Medicare FFS | Source: Ambulatory Visit | Attending: Cardiology | Admitting: Cardiology

## 2015-02-21 DIAGNOSIS — R0602 Shortness of breath: Secondary | ICD-10-CM

## 2015-02-28 ENCOUNTER — Ambulatory Visit (HOSPITAL_COMMUNITY)
Admission: RE | Admit: 2015-02-28 | Discharge: 2015-02-28 | Disposition: A | Discharge status: Home / Self Care | Payer: Medicare FFS | Source: Ambulatory Visit | Attending: Cardiology | Admitting: Cardiology

## 2015-02-28 DIAGNOSIS — R0602 Shortness of breath: Secondary | ICD-10-CM | POA: Diagnosis not present

## 2015-02-28 LAB — PULMONARY FUNCTION TEST
DL/VA % pred: 111 %
DL/VA: 4.91 ml/min/mmHg/L
DLCO COR: 22.25 ml/min/mmHg
DLCO UNC % PRED: 78 %
DLCO cor % pred: 78 %
DLCO unc: 22.25 ml/min/mmHg
FEF 25-75 PRE: 1.49 L/s
FEF 25-75 Post: 1.93 L/sec
FEF2575-%Change-Post: 29 %
FEF2575-%Pred-Post: 101 %
FEF2575-%Pred-Pre: 78 %
FEV1-%CHANGE-POST: 6 %
FEV1-%PRED-PRE: 78 %
FEV1-%Pred-Post: 83 %
FEV1-Post: 2.22 L
FEV1-Pre: 2.09 L
FEV1FVC-%Change-Post: 2 %
FEV1FVC-%Pred-Pre: 102 %
FEV6-%Change-Post: 4 %
FEV6-%PRED-POST: 84 %
FEV6-%Pred-Pre: 81 %
FEV6-PRE: 2.82 L
FEV6-Post: 2.93 L
FEV6FVC-%CHANGE-POST: 0 %
FEV6FVC-%PRED-POST: 107 %
FEV6FVC-%PRED-PRE: 106 %
FVC-%Change-Post: 3 %
FVC-%PRED-POST: 79 %
FVC-%Pred-Pre: 76 %
FVC-POST: 2.93 L
FVC-Pre: 2.84 L
POST FEV1/FVC RATIO: 76 %
POST FEV6/FVC RATIO: 100 %
Pre FEV1/FVC ratio: 74 %
Pre FEV6/FVC Ratio: 99 %
RV % pred: 125 %
RV: 3.01 L
TLC % pred: 89 %
TLC: 5.74 L

## 2015-02-28 MED ORDER — ALBUTEROL SULFATE (2.5 MG/3ML) 0.083% IN NEBU
2.5000 mg | INHALATION_SOLUTION | Freq: Once | RESPIRATORY_TRACT | Status: AC
  Administered 2015-02-28: 2.5 mg via RESPIRATORY_TRACT

## 2015-03-14 ENCOUNTER — Encounter (HOSPITAL_COMMUNITY): Payer: Self-pay | Admitting: *Deleted

## 2015-03-14 ENCOUNTER — Ambulatory Visit (HOSPITAL_COMMUNITY)
Admission: RE | Admit: 2015-03-14 | Discharge: 2015-03-14 | Disposition: A | Discharge status: Home / Self Care | Payer: Medicare FFS | Source: Ambulatory Visit | Attending: Internal Medicine | Admitting: Internal Medicine

## 2015-03-14 ENCOUNTER — Encounter (HOSPITAL_COMMUNITY)
Admission: RE | Disposition: A | Discharge status: Home / Self Care | Payer: Self-pay | Source: Ambulatory Visit | Attending: Internal Medicine

## 2015-03-14 DIAGNOSIS — K5792 Diverticulitis of intestine, part unspecified, without perforation or abscess without bleeding: Secondary | ICD-10-CM | POA: Diagnosis not present

## 2015-03-14 DIAGNOSIS — Z794 Long term (current) use of insulin: Secondary | ICD-10-CM | POA: Diagnosis not present

## 2015-03-14 DIAGNOSIS — F17210 Nicotine dependence, cigarettes, uncomplicated: Secondary | ICD-10-CM | POA: Diagnosis not present

## 2015-03-14 DIAGNOSIS — Z86010 Personal history of colonic polyps: Secondary | ICD-10-CM

## 2015-03-14 DIAGNOSIS — K649 Unspecified hemorrhoids: Secondary | ICD-10-CM | POA: Diagnosis not present

## 2015-03-14 DIAGNOSIS — I10 Essential (primary) hypertension: Secondary | ICD-10-CM | POA: Diagnosis not present

## 2015-03-14 DIAGNOSIS — K648 Other hemorrhoids: Secondary | ICD-10-CM | POA: Diagnosis not present

## 2015-03-14 DIAGNOSIS — Z9049 Acquired absence of other specified parts of digestive tract: Secondary | ICD-10-CM | POA: Diagnosis not present

## 2015-03-14 DIAGNOSIS — M1990 Unspecified osteoarthritis, unspecified site: Secondary | ICD-10-CM | POA: Diagnosis not present

## 2015-03-14 DIAGNOSIS — E119 Type 2 diabetes mellitus without complications: Secondary | ICD-10-CM | POA: Diagnosis not present

## 2015-03-14 DIAGNOSIS — J449 Chronic obstructive pulmonary disease, unspecified: Secondary | ICD-10-CM | POA: Insufficient documentation

## 2015-03-14 DIAGNOSIS — Z79899 Other long term (current) drug therapy: Secondary | ICD-10-CM | POA: Diagnosis not present

## 2015-03-14 DIAGNOSIS — D123 Benign neoplasm of transverse colon: Secondary | ICD-10-CM | POA: Insufficient documentation

## 2015-03-14 HISTORY — DX: Chronic obstructive pulmonary disease, unspecified: J44.9

## 2015-03-14 HISTORY — PX: COLONOSCOPY: SHX5424

## 2015-03-14 LAB — GLUCOSE, CAPILLARY: Glucose-Capillary: 104 mg/dL — ABNORMAL HIGH (ref 70–99)

## 2015-03-14 SURGERY — COLONOSCOPY
Anesthesia: Moderate Sedation

## 2015-03-14 MED ORDER — STERILE WATER FOR IRRIGATION IR SOLN
Status: DC | PRN
  Administered 2015-03-14: 10:00:00

## 2015-03-14 MED ORDER — METRONIDAZOLE 250 MG PO TABS: 250.0000 mg | ORAL_TABLET | Freq: Three times a day (TID) | ORAL | Status: AC

## 2015-03-14 MED ORDER — MIDAZOLAM HCL 5 MG/5ML IJ SOLN
INTRAMUSCULAR | Status: DC | PRN
  Administered 2015-03-14: 2 mg via INTRAVENOUS
  Administered 2015-03-14: 1 mg via INTRAVENOUS
  Administered 2015-03-14: 2 mg via INTRAVENOUS

## 2015-03-14 MED ORDER — MEPERIDINE HCL 50 MG/ML IJ SOLN
INTRAMUSCULAR | Status: AC
  Filled 2015-03-14: qty 1

## 2015-03-14 MED ORDER — MIDAZOLAM HCL 5 MG/5ML IJ SOLN
INTRAMUSCULAR | Status: AC
  Filled 2015-03-14: qty 10

## 2015-03-14 MED ORDER — MEPERIDINE HCL 50 MG/ML IJ SOLN
INTRAMUSCULAR | Status: DC | PRN
  Administered 2015-03-14 (×2): 25 mg via INTRAVENOUS

## 2015-03-14 MED ORDER — SODIUM CHLORIDE 0.9 % IV SOLN
INTRAVENOUS | Status: DC
  Administered 2015-03-14: 10:00:00 via INTRAVENOUS

## 2015-03-14 MED ORDER — CIPROFLOXACIN HCL 500 MG PO TABS: 500.0000 mg | ORAL_TABLET | Freq: Two times a day (BID) | ORAL | Status: DC

## 2015-03-14 NOTE — Op Note (Signed)
COLONOSCOPY PROCEDURE REPORT  PATIENT:  Peter Collier  MR#:  [id number] Birthdate:  1941-08-27, 74 y.o., male Endoscopist:  Dr. [name], MD Referred By:  Dr. Velvet Bathe A. Sherrie Sport, MD Procedure Date: 03/14/2015  Procedure:   Colonoscopy  Indications:  Patient is 74 year old Caucasian male with multiple medical problems also has history of colonic adenomas. Last colonoscopy was in August, 2015 with piecemeal polypectomy of large flat polyp at transverse colon. Piecemeal polypectomy was performed and residual polyp was treated with APC. Patient is not returning to treat residual or recurrent polyp. He does not have any GI symptoms.  Informed Consent:  The procedure and risks were reviewed with the patient and informed consent was obtained.  Medications:  Demerol 50 mg IV Versed 5 mg IV  Description of procedure:  After a digital rectal exam was performed, that colonoscope was advanced from the anus through the rectum and colon to the area of the cecum, ileocecal valve and appendiceal orifice. The cecum was deeply intubated. These structures were well-seen and photographed for the record. From the level of the cecum and ileocecal valve, the scope was slowly and cautiously withdrawn. The mucosal surfaces were carefully surveyed utilizing scope tip to flexion to facilitate fold flattening as needed. The scope was pulled down into the rectum where a thorough exam including retroflexion was performed.  Findings:   Prep satisfactory. Polypectomy site was easily identified located across from tattooed site at transverse colon. Polypectomy site was very carefully examined using close biopsy forceps to flatten the folds and no residual polyp identified. Small polyp at distal transverse colon was cold snared. There was single small ulcer surrounded by markedly eating meters and erythematous folds. Appearance was consistent with diverticulitis. Normal mucosa of rectum. Small hemorrhoids below the  dentate line.    Therapeutic/Diagnostic Maneuvers Performed:  See above  Complications:  None  Cecal Withdrawal Time:  18 minutes  Impression:  Examination performed to cecum. No residual polyp noted at polypectomy site located at transverse colon. Small polyp cold snared from distal transverse colon. Sigmoid diverticulitis. Small external hemorrhoids.  Recommendations:  Standard instructions given. Cipro 500 mg by mouth twice a day for 10 days. Metronidazole 250 mg by mouth 3 times a day for 10 days I will contact patient with biopsy results and further recommendations.  Tali Cleaves U  03/14/2015 11:05 AM  CC: Dr. Neale Burly, MD & Dr. Rayne Du ref. provider found

## 2015-03-14 NOTE — H&P (Signed)
Peter Collier is an 74 y.o. male.   Chief Complaint: Patient is here for colonoscopy. HPI: Patient is 74 year old Caucasian male with history of multiple colonic adenomas who had third colonoscopy in August, 2015 piecemeal polypectomy polyp at transverse colon. He is returning for a follow-up exam to treat residual or recurrent polyp. Patient denies abdominal pain change in bowel habits or rectal bleeding. Please note first 2 colonoscopies were by Dr. Sunday Shams of [facility] Collier. He remove multiple polyps but was not able to remove this flat polyp at transverse colon.  Past Medical History  Diagnosis Date  . Diabetes mellitus     x 25 yrs  . Hypertension   . Kidney stone   . Carotid artery occlusion   . CHF (congestive heart failure) March 30, 2012    [facility] stay 4/16-4/19/2013  . Arthritis 05/24/12    Bilateral hands. Also has RA  . COPD (chronic obstructive pulmonary disease)     Past Surgical History  Procedure Laterality Date  . Cholecystectomy  03/1997  . Eye surgery  cataract    07/2007  . Rotator cuff repair Left 08/08/2010    Shoulder  . Kidney stone surgery    . Colonoscopy  4.7.15    Dr. Britta Mccreedy  . Polypectomy  03/21/14    Sessile --Dr. Britta Mccreedy   . Colonoscopy N/A 07/26/2014    Procedure: COLONOSCOPY;  Surgeon: [name], MD;  [facility];  Service: Endoscopy;  Laterality: N/A;  100-moved to 1115 Ann to notify pt    Family History  Problem Relation Age of Onset  . Hypertension Mother   . Heart disease Father   . Hypertension Father   . Heart attack Father   . Heart disease Brother   . Colon cancer Neg Hx   . Heart disease Brother     CHF- Before age 45  . Diabetes Brother   . Hypertension Brother    Social History:  reports that he quit smoking about 53 years ago. His smoking use included Cigarettes. He has a 13.5 pack-year smoking history. He has never used smokeless tobacco. He reports that he does not drink alcohol or use illicit  drugs.  Allergies: No Known Allergies  Medications Prior to Admission  Medication Sig Dispense Refill  . amLODipine (NORVASC) 2.5 MG tablet Take 2.5 mg by mouth daily.    . carvedilol (COREG) 12.5 MG tablet Take 12.5 mg by mouth daily.     . cholecalciferol (VITAMIN D) 1000 UNITS tablet Take 1,000 Units by mouth daily.     . fish oil-omega-3 fatty acids 1000 MG capsule Take 1,000 mg by mouth daily.      . folic acid (FOLVITE) 1 MG tablet Take 1 mg by mouth daily.     Marland Kitchen glimepiride (AMARYL) 2 MG tablet Take 4 mg by mouth daily before breakfast. 2 tablets daily    . polyethylene glycol-electrolytes (NULYTELY/GOLYTELY) 420 G solution Take 4,000 mLs by mouth once. 4000 mL 0  . Umeclidinium-Vilanterol 62.5-25 MCG/INH AEPB Inhale 1 puff into the lungs daily.    . hydroxychloroquine (PLAQUENIL) 200 MG tablet Take 200 mg by mouth 2 (two) times daily.    . insulin aspart (NOVOLOG) 100 UNIT/ML injection Inject 5 Units into the skin 3 (three) times daily before meals.     . insulin glargine (LANTUS) 100 UNIT/ML injection Inject 35 Units into the skin at bedtime.     . isosorbide mononitrate (IMDUR) 30 MG 24 hr tablet Take  30 mg by mouth daily.     . methotrexate (RHEUMATREX) 2.5 MG tablet Take 10 mg by mouth once a week.     . spironolactone (ALDACTONE) 25 MG tablet Take 12.5 mg by mouth daily.    Marland Kitchen torsemide (DEMADEX) 20 MG tablet Take 20-40 mg by mouth 2 (two) times daily. 40mg  in the am and 20mg  in the pm    . vitamin B-12 (CYANOCOBALAMIN) 1000 MCG tablet Take 500 mcg by mouth daily.       Results for orders placed or performed during the [facility] encounter of 03/14/15 (from the past 48 hour(s))  Glucose, capillary     Status: Abnormal   Collection Time: 03/14/15  9:22 AM  Result Value Ref Range   Glucose-Capillary 104 (H) 70 - 99 mg/dL   Comment 1 Notify RN    No results found.  ROS  Blood pressure 161/96, pulse 77, temperature 97.4 F (36.3 C), temperature source Oral, resp. rate 17,  height 5\' 7"  (1.702 m), weight 184 lb (83.462 kg), SpO2 98 %. Physical Exam  Constitutional: He appears well-developed and well-nourished.  HENT:  Mouth/Throat: Oropharynx is clear and moist.  Eyes: Conjunctivae are normal. No scleral icterus.  Neck: No thyromegaly present.  Cardiovascular: Normal rate, regular rhythm and normal heart sounds.   No murmur heard. Respiratory: Effort normal and breath sounds normal.  GI: Soft. He exhibits no distension and no mass. There is no tenderness.  Musculoskeletal: He exhibits no edema.  Neurological: He is alert.  Skin: Skin is warm and dry.     Assessment/Plan History of multiple colonic adenomas. Surveillance colonoscopy.  Ksean Vale U 03/14/2015, 10:23 AM

## 2015-03-14 NOTE — Discharge Instructions (Signed)
Resume usual medications and diet. Cipro 500 mg by mouth twice daily for 10 days. Metronidazole 250 mg by mouth 3 times a day or after each meal for 10 days. No driving for 24 hours. Physician will call with biopsy results.  Colon Polyps Polyps are lumps of extra tissue growing inside the body. Polyps can grow in the large intestine (colon). Most colon polyps are noncancerous (benign). However, some colon polyps can become cancerous over time. Polyps that are larger than a pea may be harmful. To be safe, caregivers remove and test all polyps. CAUSES  Polyps form when mutations in the genes cause your cells to grow and divide even though no more tissue is needed. RISK FACTORS There are a number of risk factors that can increase your chances of getting colon polyps. They include:  Being older than 50 years.  Family history of colon polyps or colon cancer.  Long-term colon diseases, such as colitis or Crohn disease.  Being overweight.  Smoking.  Being inactive.  Drinking too much alcohol. SYMPTOMS  Most small polyps do not cause symptoms. If symptoms are present, they may include:  Blood in the stool. The stool may look dark red or black.  Constipation or diarrhea that lasts longer than 1 week. DIAGNOSIS People often do not know they have polyps until their caregiver finds them during a regular checkup. Your caregiver can use 4 tests to check for polyps:  Digital rectal exam. The caregiver wears gloves and feels inside the rectum. This test would find polyps only in the rectum.  Barium enema. The caregiver puts a liquid called barium into your rectum before taking X-rays of your colon. Barium makes your colon look white. Polyps are dark, so they are easy to see in the X-ray pictures.  Sigmoidoscopy. A thin, flexible tube (sigmoidoscope) is placed into your rectum. The sigmoidoscope has a light and tiny camera in it. The caregiver uses the sigmoidoscope to look at the last third of  your colon.  Colonoscopy. This test is like sigmoidoscopy, but the caregiver looks at the entire colon. This is the most common method for finding and removing polyps. TREATMENT  Any polyps will be removed during a sigmoidoscopy or colonoscopy. The polyps are then tested for cancer. PREVENTION  To help lower your risk of getting more colon polyps:  Eat plenty of fruits and vegetables. Avoid eating fatty foods.  Do not smoke.  Avoid drinking alcohol.  Exercise every day.  Lose weight if recommended by your caregiver.  Eat plenty of calcium and folate. Foods that are rich in calcium include milk, cheese, and broccoli. Foods that are rich in folate include chickpeas, kidney beans, and spinach. HOME CARE INSTRUCTIONS Keep all follow-up appointments as directed by your caregiver. You may need periodic exams to check for polyps. SEEK MEDICAL CARE IF: You notice bleeding during a bowel movement. Document Released: 08/27/2004 Document Revised: 02/23/2012 Document Reviewed: 02/10/2012 [facility] Patient Information 2015 [place], [place]. This information is not intended to replace advice given to you by your health care provider. Make sure you discuss any questions you have with your health care provider. Colonoscopy, Care After These instructions give you information on caring for yourself after your procedure. Your doctor may also give you more specific instructions. Call your doctor if you have any problems or questions after your procedure. HOME CARE  Do not drive for 24 hours.  Do not sign important papers or use machinery for 24 hours.  You may shower.  You  may go back to your usual activities, but go slower for the first 24 hours.  Take rest breaks often during the first 24 hours.  Walk around or use warm packs on your belly (abdomen) if you have belly cramping or gas.  Drink enough fluids to keep your pee (urine) clear or pale yellow.  Resume your normal diet. Avoid heavy or  fried foods.  Avoid drinking alcohol for 24 hours or as told by your doctor.  Only take medicines as told by your doctor. If a tissue sample (biopsy) was taken during the procedure:   Do not take aspirin or blood thinners for 7 days, or as told by your doctor.  Do not drink alcohol for 7 days, or as told by your doctor.  Eat soft foods for the first 24 hours. GET HELP IF: You still have a small amount of blood in your poop (stool) 2-3 days after the procedure. GET HELP RIGHT AWAY IF:  You have more than a small amount of blood in your poop.  You see clumps of tissue (blood clots) in your poop.  Your belly is puffy (swollen).  You feel sick to your stomach (nauseous) or throw up (vomit).  You have a fever.  You have belly pain that gets worse and medicine does not help. MAKE SURE YOU:  Understand these instructions.  Will watch your condition.  Will get help right away if you are not doing well or get worse. Document Released: 01/03/2011 Document Revised: 12/06/2013 Document Reviewed: 08/08/2013 [facility] Patient Information 2015 [place], [place]. This information is not intended to replace advice given to you by your health care provider. Make sure you discuss any questions you have with your health care provider.

## 2015-03-15 ENCOUNTER — Encounter (HOSPITAL_COMMUNITY): Payer: Self-pay | Admitting: Internal Medicine

## 2015-03-27 ENCOUNTER — Encounter (INDEPENDENT_AMBULATORY_CARE_PROVIDER_SITE_OTHER): Payer: Self-pay | Admitting: *Deleted

## 2015-07-27 IMAGING — NM NM MYOCAR MULTI W/SPECT W/WALL MOTION & EF
2 series · 12 of 12 positions shown · non-contrast
Comparison: None.

CLINICAL DATA: 73-year-old male with no known history of coronary
artery disease referred for shortness of breath.

EXAM:
MYOCARDIAL IMAGING WITH SPECT (REST AND PHARMACOLOGIC-STRESS)
GATED LEFT VENTRICULAR WALL MOTION STUDY
LEFT VENTRICULAR EJECTION FRACTION
TECHNIQUE: Standard myocardial SPECT imaging was performed after resting
intravenous injection of 10 mCi Sc-OOm sestamibi. Subsequently,
intravenous infusion of Lexiscan was performed under the supervision
of the Cardiology staff. At peak effect of the drug, 30 mCi Sc-OOm
sestamibi was injected intravenously and standard myocardial SPECT
imaging was performed. Quantitative gated imaging was also performed
to evaluate left ventricular wall motion, and estimate left
ventricular ejection fraction.

[Series 1: rest · 8.28mm/px · 6 of 64 frames shown]
[frame 6/64]
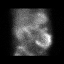
[frame 16/64]
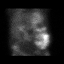
[frame 27/64]
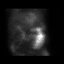
[frame 38/64]
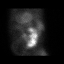
[frame 48/64]
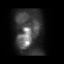
[frame 59/64]
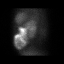

[Series 2: stress gated · 8.28mm/px · 6 of 64 frames shown]
[frame 6/64]
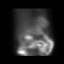
[frame 16/64]
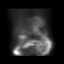
[frame 27/64]
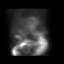
[frame 38/64]
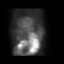
[frame 48/64]
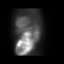
[frame 59/64]
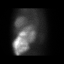

[12 of 12 positions shown; findings below may reference images not displayed]

FINDINGS: Pharmacological stress

Baseline EKG shows sinus rhythm with right bundle branch block and
probable left posterior fascicular block. After injection heart rate
increased from 71 beats per min up to 83 beats per min and blood
pressure increased from 149/88 up to 157/85. The test was stopped
after injection was complete, the patient did not experience any
chest pain. After injection there were no specific ischemic changes
and no significant arrhythmias.

Perfusion: There is a moderate-sized moderate intensity inferior
wall defect seen in the resting images. A similar but less intense
defect is seen in the post-injection images. The inferior wall has
normal wall motion. Findings most consistent with sub- diaphragmatic
attenuation. There is also noted radiotracer uptake in the adjacent
gut of the inferoseptal wall, this likely is also contributing to
the artifact.

Wall Motion: There is normal wall motion. The left ventricle is
mildly dilated by volume.

Left Ventricular Ejection Fraction: 40 %

End diastolic volume 154 ml

End systolic volume 61 ml
IMPRESSION: 1. No reversible ischemia or infarction.

2. Normal left ventricular wall motion.

3. Left ventricular ejection fraction 40%

4. Intermediate-risk stress test findings*. Intermediate risk based
on the mildly decreased left ventricular ejection fraction.
Recommend correlating with echocardiogram. There is no current
myocardium at jeopardy.

*5065 Appropriate Use Criteria for Coronary Revascularization
Focused Update: J Am Coll Cardiol. 5065;59(9):857-881.
[URL]

## 2015-08-21 ENCOUNTER — Other Ambulatory Visit (HOSPITAL_COMMUNITY): Payer: Medicare PPO

## 2015-08-21 ENCOUNTER — Ambulatory Visit: Payer: Medicare PPO | Admitting: Family

## 2015-08-29 ENCOUNTER — Encounter: Payer: Self-pay | Admitting: Family

## 2015-08-31 ENCOUNTER — Encounter: Payer: Self-pay | Admitting: Family

## 2015-08-31 ENCOUNTER — Ambulatory Visit (INDEPENDENT_AMBULATORY_CARE_PROVIDER_SITE_OTHER): Payer: Medicare FFS | Admitting: Family

## 2015-08-31 ENCOUNTER — Ambulatory Visit (HOSPITAL_COMMUNITY)
Admission: RE | Admit: 2015-08-31 | Discharge: 2015-08-31 | Disposition: A | Discharge status: Home / Self Care | Payer: Medicare FFS | Source: Ambulatory Visit | Attending: Family | Admitting: Family

## 2015-08-31 ENCOUNTER — Other Ambulatory Visit: Payer: Self-pay | Admitting: Family

## 2015-08-31 VITALS — BP 137/84 | HR 67 | Temp 97.0°F | Resp 18 | Ht 67.0 in | Wt 195.0 lb

## 2015-08-31 DIAGNOSIS — I6523 Occlusion and stenosis of bilateral carotid arteries: Secondary | ICD-10-CM

## 2015-08-31 DIAGNOSIS — I10 Essential (primary) hypertension: Secondary | ICD-10-CM | POA: Diagnosis not present

## 2015-08-31 DIAGNOSIS — E119 Type 2 diabetes mellitus without complications: Secondary | ICD-10-CM | POA: Diagnosis not present

## 2015-08-31 NOTE — Patient Instructions (Signed)
Stroke Prevention Some medical conditions and behaviors are associated with an increased chance of having a stroke. You may prevent a stroke by making healthy choices and managing medical conditions. HOW CAN I REDUCE MY RISK OF HAVING A STROKE?   Stay physically active. Get at least 30 minutes of activity on most or all days.  Do not smoke. It may also be helpful to avoid exposure to secondhand smoke.  Limit alcohol use. Moderate alcohol use is considered to be:  No more than 2 drinks per day for men.  No more than 1 drink per day for nonpregnant women.  Eat healthy foods. This involves:  Eating 5 or more servings of fruits and vegetables a day.  Making dietary changes that address high blood pressure (hypertension), high cholesterol, diabetes, or obesity.  Manage your cholesterol levels.  Making food choices that are high in fiber and low in saturated fat, trans fat, and cholesterol may control cholesterol levels.  Take any prescribed medicines to control cholesterol as directed by your health care provider.  Manage your diabetes.  Controlling your carbohydrate and sugar intake is recommended to manage diabetes.  Take any prescribed medicines to control diabetes as directed by your health care provider.  Control your hypertension.  Making food choices that are low in salt (sodium), saturated fat, trans fat, and cholesterol is recommended to manage hypertension.  Take any prescribed medicines to control hypertension as directed by your health care provider.  Maintain a healthy weight.  Reducing calorie intake and making food choices that are low in sodium, saturated fat, trans fat, and cholesterol are recommended to manage weight.  Stop drug abuse.  Avoid taking birth control pills.  Talk to your health care provider about the risks of taking birth control pills if you are over 35 years old, smoke, get migraines, or have ever had a blood clot.  Get evaluated for sleep  disorders (sleep apnea).  Talk to your health care provider about getting a sleep evaluation if you snore a lot or have excessive sleepiness.  Take medicines only as directed by your health care provider.  For some people, aspirin or blood thinners (anticoagulants) are helpful in reducing the risk of forming abnormal blood clots that can lead to stroke. If you have the irregular heart rhythm of atrial fibrillation, you should be on a blood thinner unless there is a good reason you cannot take them.  Understand all your medicine instructions.  Make sure that other conditions (such as anemia or atherosclerosis) are addressed. SEEK IMMEDIATE MEDICAL CARE IF:   You have sudden weakness or numbness of the face, arm, or leg, especially on one side of the body.  Your face or eyelid droops to one side.  You have sudden confusion.  You have trouble speaking (aphasia) or understanding.  You have sudden trouble seeing in one or both eyes.  You have sudden trouble walking.  You have dizziness.  You have a loss of balance or coordination.  You have a sudden, severe headache with no known cause.  You have new chest pain or an irregular heartbeat. Any of these symptoms may represent a serious problem that is an emergency. Do not wait to see if the symptoms will go away. Get medical help at once. Call your local emergency services (911 in [place] Do not drive yourself to the hospital. Document Released: 01/08/2005 Document Revised: 04/17/2014 Document Reviewed: 06/03/2013 Exit[employer] Patient Information 2015 [employer]. This information is not intended to replace advice given   to you by your health care provider. Make sure you discuss any questions you have with your health care provider.  

## 2015-08-31 NOTE — Progress Notes (Signed)
Established Carotid Patient   History of Present Illness  Peter Collier is a 74 y.o. male patient of Dr. Donnetta Hutching who has known carotid stenosis. He returns today for follow up.  Patient has not had previous carotid artery intervention.  Patient has no history of TIA or stroke symptoms.Specifically the patient denies a history of amaurosis fugax or monocular blindness, unilateral facial drooping, hemiplegia, or receptive or expressive aphasia.   Pt reports New Medical or Surgical History: it was found that he has mild emphysema, pt does not think breathing treatments have helped. Pt has documented CHF, is trying to get better insurance coverage for a heart cath.  He denies claudication symptoms in legs with walking, denies non healing wounds.  His walking is limited by his dyspnea. 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  Pt states his blood pressure at home is 116-130's/60'-70's.  Pt Diabetic: Yes, states in better control lately Pt smoker: former smoker, quit in 1963  Pt meds include: Statin : No: wife states his cholesterol is a little elevated, wife is not in favor of pt taking a statin ASA: Yes, every other day Other anticoagulants/antiplatelets: no     Past Medical History  Diagnosis Date  . Diabetes mellitus     x 25 yrs  . Hypertension   . Kidney stone   . Carotid artery occlusion   . CHF (congestive heart failure) March 30, 2012    Hospital stay 4/16-4/19/2013  . Arthritis 05/24/12    Bilateral hands. Also has RA  . COPD (chronic obstructive pulmonary disease)     Social History Social History  Substance Use Topics  . Smoking status: Former Smoker -- 1.50 packs/day for 9 years    Types: Cigarettes    Quit date: 12/15/1961  . Smokeless tobacco: Never Used  . Alcohol Use: No    Family History Family History  Problem Relation Age of Onset  . Hypertension Mother   . Heart disease Father   . Hypertension Father   . Heart attack  Father   . Heart disease Brother   . Colon cancer Neg Hx   . Heart disease Brother     CHF- Before age 59  . Diabetes Brother   . Hypertension Brother     Surgical History Past Surgical History  Procedure Laterality Date  . Cholecystectomy  03/1997  . Eye surgery  cataract    07/2007  . Rotator cuff repair Left 08/08/2010    Shoulder  . Kidney stone surgery    . Colonoscopy  4.7.15    Dr. Britta Mccreedy  . Polypectomy  03/21/14    Sessile --Dr. Britta Mccreedy   . Colonoscopy N/A 07/26/2014    Procedure: COLONOSCOPY;  Surgeon: [name], MD;  [facility];  Service: Endoscopy;  Laterality: N/A;  100-moved to 1115 Ann to notify pt  . Colonoscopy N/A 03/14/2015    Procedure: COLONOSCOPY;  Surgeon: [name], MD;  Location: [facility];  Service: Endoscopy;  Laterality: N/A;  1030    No Known Allergies  Current Outpatient Prescriptions  Medication Sig Dispense Refill  . amLODipine (NORVASC) 2.5 MG tablet Take 2.5 mg by mouth daily.    . carvedilol (COREG) 12.5 MG tablet Take 12.5 mg by mouth daily.     . cholecalciferol (VITAMIN D) 1000 UNITS tablet Take 1,000 Units by mouth daily.     . fish oil-omega-3 fatty acids 1000 MG capsule Take 1,000 mg by mouth  daily.      . folic acid (FOLVITE) 1 MG tablet Take 1 mg by mouth daily.     Marland Kitchen glimepiride (AMARYL) 2 MG tablet Take 4 mg by mouth daily before breakfast. 2 tablets daily    . hydroxychloroquine (PLAQUENIL) 200 MG tablet Take 200 mg by mouth 2 (two) times daily.    . insulin aspart (NOVOLOG) 100 UNIT/ML injection Inject 5 Units into the skin 3 (three) times daily before meals.     . insulin glargine (LANTUS) 100 UNIT/ML injection Inject 35 Units into the skin at bedtime.     . isosorbide mononitrate (IMDUR) 30 MG 24 hr tablet Take 30 mg by mouth daily.     . methotrexate (RHEUMATREX) 2.5 MG tablet Take 10 mg by mouth once a week.     . spironolactone (ALDACTONE) 25 MG tablet Take 12.5 mg by mouth daily.    Marland Kitchen torsemide  (DEMADEX) 20 MG tablet Take 20-40 mg by mouth 2 (two) times daily. 40mg  in the am and 20mg  in the pm    . Umeclidinium-Vilanterol 62.5-25 MCG/INH AEPB Inhale 1 puff into the lungs daily.    . vitamin B-12 (CYANOCOBALAMIN) 1000 MCG tablet Take 500 mcg by mouth daily.     . ciprofloxacin (CIPRO) 500 MG tablet Take 1 tablet (500 mg total) by mouth 2 (two) times daily. (Patient not taking: Reported on 08/31/2015) 20 tablet 0  . metroNIDAZOLE (FLAGYL) 250 MG tablet Take 1 tablet (250 mg total) by mouth 3 (three) times daily. (Patient not taking: Reported on 08/31/2015) 30 tablet 0   No current facility-administered medications for this visit.    Review of Systems : See HPI for pertinent positives and negatives.  Physical Examination  Filed Vitals:   08/31/15 1121 08/31/15 1122 08/31/15 1124  BP: 155/92 142/87 137/84  Pulse: 71 67 67  Temp: 97 F (36.1 C)    Resp: 18    Height: 5\' 7"  (1.702 m)    Weight: 195 lb (88.451 kg)    SpO2: 97%     Body mass index is 30.53 kg/(m^2).  General: WDWN obese male in NAD GAIT: normal Eyes: PERRLA Pulmonary: Non-labored, CTAB, Negative Rales, Negative rhonchi, & Negative wheezing.  Cardiac: regular Rhythm, no detected murmur.  VASCULAR EXAM Carotid Bruits Right Left   Negative Negative   Aorta is not palpable. Radial pulses are 2+ palpable and equal.      LE Pulses Right Left   POPLITEAL not palpable  not palpable   POSTERIOR TIBIAL  palpable   palpable    DORSALIS PEDIS  ANTERIOR TIBIAL palpable  palpable     Gastrointestinal: soft, nontender, BS WNL, no r/g,no masses palpated.  Musculoskeletal: Negative muscle atrophy/wasting. M/S 5/5 throughout, Extremities without ischemic changes.  Neurologic: A&O X 3; Appropriate Affect, Speech is normal CN 2-12 intact  except some hearing loss, Pain and light touch intact in extremities, Motor exam as listed above.         Non-Invasive Vascular Imaging CAROTID DUPLEX 08/31/2015   CEREBROVASCULAR DUPLEX EVALUATION    INDICATION: Carotid stenosis    PREVIOUS INTERVENTION(S): NA    DUPLEX EXAM:     RIGHT  LEFT  Peak Systolic Velocities (cm/s) End Diastolic Velocities (cm/s) Plaque LOCATION Peak Systolic Velocities (cm/s) End Diastolic Velocities (cm/s) Plaque  90 14  CCA PROXIMAL 88 13   68 13  CCA MID 90 19   78 17 HT CCA DISTAL 75 14 HT  115 13  ECA 95 8  87 25 HT ICA PROXIMAL 142 40 HT  60 14  ICA MID 116 29   54 13  ICA DISTAL 52 14     1.28 ICA / CCA Ratio (PSV) 1.58  Antegrade Vertebral Flow Antegrade  707 Brachial Systolic Pressure (mmHg) 867  Triphasic Brachial Artery Waveforms Triphasic    Plaque Morphology:  HM = Homogeneous, HT = Heterogeneous, CP = Calcific Plaque, SP = Smooth Plaque, IP = Irregular Plaque     ADDITIONAL FINDINGS: Right subclavian artery PSV90cm/sec ;Left subclavian artery PSV107cm/sec    IMPRESSION: Right internal carotid artery stenosis present in the less than 40% range. Left internal carotid artery stenosis present in the 40%-59% range.    Compared to the previous exam:  Essentially unchanged since previous study on 08/15/2014.      Assessment: Peter Collier is a 74 y.o. male who has no history of stroke or TIA. Today's carotid duplex suggests minimal stenosis of the right ICA and mild/moderate stenosis of the left ICA. Essentially unchanged since previous study on 08/15/2014.   Plan: Follow-up in 1 year with Carotid Duplex.   I discussed in depth with the patient the nature of atherosclerosis, and emphasized the importance of maximal medical management including strict control of blood pressure, blood glucose, and lipid levels, obtaining regular exercise, and continued cessation of smoking.  The patient is aware that without maximal medical  management the underlying atherosclerotic disease process will progress, limiting the benefit of any interventions. The patient was given information about stroke prevention and what symptoms should prompt the patient to seek immediate medical care. Thank you for allowing [place] to participate in this patient's care.  Clemon Chambers, RN, MSN, FNP-C [facility] Office: [phone]  Clinic Physician: Bridgett Larsson  08/31/2015 11:34 AM

## 2015-08-31 NOTE — Progress Notes (Signed)
Filed Vitals:   08/31/15 1121 08/31/15 1122 08/31/15 1124  BP: 155/92 142/87 137/84  Pulse: 71 67 67  Temp: 97 F (36.1 C)    Resp: 18    Height: 5\' 7"  (1.702 m)    Weight: 195 lb (88.451 kg)    SpO2: 97%

## 2016-08-28 ENCOUNTER — Encounter: Payer: Self-pay | Admitting: Family

## 2016-09-02 ENCOUNTER — Ambulatory Visit (HOSPITAL_COMMUNITY)
Admission: RE | Admit: 2016-09-02 | Discharge: 2016-09-02 | Disposition: A | Discharge status: Home / Self Care | Payer: Medicare PPO | Source: Ambulatory Visit | Attending: Family | Admitting: Family

## 2016-09-02 ENCOUNTER — Ambulatory Visit (INDEPENDENT_AMBULATORY_CARE_PROVIDER_SITE_OTHER): Payer: Medicare PPO | Admitting: Family

## 2016-09-02 ENCOUNTER — Encounter: Payer: Self-pay | Admitting: Family

## 2016-09-02 VITALS — BP 124/80 | HR 64 | Temp 97.5°F | Resp 20 | Ht 67.0 in | Wt 201.0 lb

## 2016-09-02 DIAGNOSIS — I6523 Occlusion and stenosis of bilateral carotid arteries: Secondary | ICD-10-CM

## 2016-09-02 DIAGNOSIS — Z87891 Personal history of nicotine dependence: Secondary | ICD-10-CM

## 2016-09-02 LAB — VAS US CAROTID
LCCAPDIAS: 17 cm/s
LCCAPSYS: 134 cm/s
LEFT ECA DIAS: -10 cm/s
LEFT VERTEBRAL DIAS: 16 cm/s
LICAPSYS: -76 cm/s
Left CCA dist dias: 20 cm/s
Left CCA dist sys: 87 cm/s
Left ICA prox dias: -18 cm/s
RIGHT CCA MID DIAS: 14 cm/s
RIGHT ECA DIAS: -9 cm/s
RIGHT VERTEBRAL DIAS: 13 cm/s
Right CCA prox dias: 8 cm/s
Right CCA prox sys: 83 cm/s
Right cca dist sys: -68 cm/s

## 2016-09-02 NOTE — Patient Instructions (Signed)
Stroke Prevention Some medical conditions and behaviors are associated with an increased chance of having a stroke. You may prevent a stroke by making healthy choices and managing medical conditions. HOW CAN I REDUCE MY RISK OF HAVING A STROKE?   Stay physically active. Get at least 30 minutes of activity on most or all days.  Do not smoke. It may also be helpful to avoid exposure to secondhand smoke.  Limit alcohol use. Moderate alcohol use is considered to be:  No more than 2 drinks per day for men.  No more than 1 drink per day for nonpregnant women.  Eat healthy foods. This involves:  Eating 5 or more servings of fruits and vegetables a day.  Making dietary changes that address high blood pressure (hypertension), high cholesterol, diabetes, or obesity.  Manage your cholesterol levels.  Making food choices that are high in fiber and low in saturated fat, trans fat, and cholesterol may control cholesterol levels.  Take any prescribed medicines to control cholesterol as directed by your health care provider.  Manage your diabetes.  Controlling your carbohydrate and sugar intake is recommended to manage diabetes.  Take any prescribed medicines to control diabetes as directed by your health care provider.  Control your hypertension.  Making food choices that are low in salt (sodium), saturated fat, trans fat, and cholesterol is recommended to manage hypertension.  Ask your health care provider if you need treatment to lower your blood pressure. Take any prescribed medicines to control hypertension as directed by your health care provider.  If you are 18-39 years of age, have your blood pressure checked every 3-5 years. If you are 40 years of age or older, have your blood pressure checked every year.  Maintain a healthy weight.  Reducing calorie intake and making food choices that are low in sodium, saturated fat, trans fat, and cholesterol are recommended to manage  weight.  Stop drug abuse.  Avoid taking birth control pills.  Talk to your health care provider about the risks of taking birth control pills if you are over 35 years old, smoke, get migraines, or have ever had a blood clot.  Get evaluated for sleep disorders (sleep apnea).  Talk to your health care provider about getting a sleep evaluation if you snore a lot or have excessive sleepiness.  Take medicines only as directed by your health care provider.  For some people, aspirin or blood thinners (anticoagulants) are helpful in reducing the risk of forming abnormal blood clots that can lead to stroke. If you have the irregular heart rhythm of atrial fibrillation, you should be on a blood thinner unless there is a good reason you cannot take them.  Understand all your medicine instructions.  Make sure that other conditions (such as anemia or atherosclerosis) are addressed. SEEK IMMEDIATE MEDICAL CARE IF:   You have sudden weakness or numbness of the face, arm, or leg, especially on one side of the body.  Your face or eyelid droops to one side.  You have sudden confusion.  You have trouble speaking (aphasia) or understanding.  You have sudden trouble seeing in one or both eyes.  You have sudden trouble walking.  You have dizziness.  You have a loss of balance or coordination.  You have a sudden, severe headache with no known cause.  You have new chest pain or an irregular heartbeat. Any of these symptoms may represent a serious problem that is an emergency. Do not wait to see if the symptoms will   go away. Get medical help at once. Call your local emergency services (911 in [place] Do not drive yourself to the hospital.   This information is not intended to replace advice given to you by your health care provider. Make sure you discuss any questions you have with your health care provider.   Document Released: 01/08/2005 Document Revised: 12/22/2014 Document Reviewed:  06/03/2013 Elsevier Interactive Patient Education 2016 [employer] Inc[employer]  

## 2016-09-02 NOTE — Progress Notes (Signed)
Chief Complaint: Follow up Extracranial Carotid Artery Stenosis   History of Present Illness  Peter Collier is a 75 y.o. male patient of Dr. Donnetta Hutching who returns for follow up of his extracranial carotid artery stenosis.  Patient has not had previous carotid artery intervention.  Patient has no history of TIA or stroke symptoms.Specifically the patient denies a history of amaurosis fugax or monocular blindness, unilateral facial drooping, hemiplegia, or receptive or expressive aphasia.   Pt reports New Medical or Surgical History: it was found that he has mild emphysema, pt does not think breathing treatments have helped. Pt has documented CHF, is trying to get better insurance coverage for a heart cath.  He denies claudication symptoms in legs with walking, denies non healing wounds.  His walking is limited by his dyspnea. 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  Pt Diabetic: Yes, states last A1C was 7.5 Pt smoker: former smoker, quit in 1963  Pt meds include: Statin : No: wife states his cholesterol is a little elevated, wife is not in favor of pt taking a statin ASA: Yes, every other day Other anticoagulants/antiplatelets: no    Past Medical History:  Diagnosis Date  . Arthritis 05/24/12   Bilateral hands. Also has RA  . Carotid artery occlusion   . CHF (congestive heart failure) ([place]) March 30, 2012   Hospital stay 4/16-4/19/2013  . COPD (chronic obstructive pulmonary disease) ([place])   . Diabetes mellitus    x 25 yrs  . Hypertension   . Kidney stone     Social History Social History  Substance Use Topics  . Smoking status: Former Smoker    Packs/day: 1.50    Years: 9.00    Types: Cigarettes    Quit date: 12/15/1961  . Smokeless tobacco: Never Used  . Alcohol use No    Family History Family History  Problem Relation Age of Onset  . Hypertension Mother   . Heart disease Father   . Hypertension Father   . Heart attack Father    . Heart disease Brother   . Colon cancer Neg Hx   . Heart disease Brother     CHF- Before age 63  . Diabetes Brother   . Hypertension Brother     Surgical History Past Surgical History:  Procedure Laterality Date  . CHOLECYSTECTOMY  03/1997  . COLONOSCOPY  4.7.15   Dr. Britta Mccreedy  . COLONOSCOPY N/A 07/26/2014   Procedure: COLONOSCOPY;  Surgeon: [name], MD;  [facility];  Service: Endoscopy;  Laterality: N/A;  100-moved to 1115 Ann to notify pt  . COLONOSCOPY N/A 03/14/2015   Procedure: COLONOSCOPY;  Surgeon: [name], MD;  [facility];  Service: Endoscopy;  Laterality: N/A;  1030  . EYE SURGERY  cataract   07/2007  . KIDNEY STONE SURGERY    . POLYPECTOMY  03/21/14   Sessile --Dr. Britta Mccreedy   . ROTATOR CUFF REPAIR Left 08/08/2010   Shoulder    No Known Allergies  Current Outpatient Prescriptions  Medication Sig Dispense Refill  . allopurinol (ZYLOPRIM) 100 MG tablet Take 100 mg by mouth daily.    . carvedilol (COREG) 12.5 MG tablet Take 12.5 mg by mouth daily.     . cholecalciferol (VITAMIN D) 1000 UNITS tablet Take 1,000 Units by mouth daily.     . famotidine (PEPCID) 20 MG tablet Take 20 mg by mouth daily.    . fish oil-omega-3 fatty acids 1000  MG capsule Take 1,000 mg by mouth daily.      . folic acid (FOLVITE) 1 MG tablet Take 1 mg by mouth daily.     . hydroxychloroquine (PLAQUENIL) 200 MG tablet Take 200 mg by mouth 2 (two) times daily.    . insulin glargine (LANTUS) 100 UNIT/ML injection Inject 35 Units into the skin at bedtime.     . isosorbide mononitrate (IMDUR) 30 MG 24 hr tablet Take 30 mg by mouth daily.     . sacubitril-valsartan (ENTRESTO) 49-51 MG Take 1 tablet by mouth 2 (two) times daily.    Marland Kitchen torsemide (DEMADEX) 20 MG tablet Take 20-40 mg by mouth 2 (two) times daily. 40mg  in the am and 20mg  in the pm    . vitamin B-12 (CYANOCOBALAMIN) 1000 MCG tablet Take 500 mcg by mouth daily.     Marland Kitchen amLODipine (NORVASC) 2.5 MG tablet Take 2.5  mg by mouth daily.    Marland Kitchen glimepiride (AMARYL) 2 MG tablet Take 4 mg by mouth daily before breakfast. 2 tablets daily    . insulin aspart (NOVOLOG) 100 UNIT/ML injection Inject 5 Units into the skin 3 (three) times daily before meals.     . methotrexate (RHEUMATREX) 2.5 MG tablet Take 10 mg by mouth once a week.     . metroNIDAZOLE (FLAGYL) 250 MG tablet Take 1 tablet (250 mg total) by mouth 3 (three) times daily. (Patient not taking: Reported on 09/02/2016) 30 tablet 0  . spironolactone (ALDACTONE) 25 MG tablet Take 12.5 mg by mouth daily.    Marland Kitchen Umeclidinium-Vilanterol 62.5-25 MCG/INH AEPB Inhale 1 puff into the lungs daily.     No current facility-administered medications for this visit.     Review of Systems : See HPI for pertinent positives and negatives.  Physical Examination  Vitals:   09/02/16 1325 09/02/16 1327  BP: 130/70 124/80  Pulse: 64   Resp: 20   Temp: 97.5 F (36.4 C)   TempSrc: Oral   SpO2: 97%   Weight: 201 lb (91.2 kg)   Height: 5\' 7"  (1.702 m)    Body mass index is 31.48 kg/m.  General: WDWN obese male in NAD GAIT: normal Eyes: PERRLA Pulmonary: Respirations are slightly labored at rest, more labored with slow walking, CTAB, good air movement in all fields, no rales,  rhonchi, or wheezing.  Cardiac: regular rhythm and rate, no detected murmur.  VASCULAR EXAM Carotid Bruits Right Left   Negative Negative   Aorta is not palpable. Radial pulses are 2+ palpable and equal.      LE Pulses Right Left   POPLITEAL not palpable  not palpable   POSTERIOR TIBIAL  not palpable  not palpable    DORSALIS PEDIS  ANTERIOR TIBIAL faintly palpable  faintly palpable     Gastrointestinal: soft, nontender, BS WNL, no r/g,no masses palpated.  Musculoskeletal: No muscle atrophy/wasting. M/S  5/5 throughout, Extremities without ischemic changes.  Neurologic: A&O X 3; Appropriate Affect, Speech is normal CN 2-12 intact except some hearing loss, Pain and light touch intact in extremities, Motor exam as listed above.    Assessment: Peter Collier is a 75 y.o. male who has no history of stroke or TIA.  DATA Today's carotid duplex suggests minimal stenosis of the right ICA (<40%) and mild/moderate stenosis of the left ICA (40-59%). Essentially unchanged since previous studies on 08/15/2014 and 08/31/15.   Plan: Follow-up in 1 year with Carotid Duplex scan.   I discussed in depth with the patient the  nature of atherosclerosis, and emphasized the importance of maximal medical management including strict control of blood pressure, blood glucose, and lipid levels, obtaining regular exercise, and continued cessation of smoking.  The patient is aware that without maximal medical management the underlying atherosclerotic disease process will progress, limiting the benefit of any interventions. The patient was given information about stroke prevention and what symptoms should prompt the patient to seek immediate medical care. Thank you for allowing [place] to participate in this patient's care.  Clemon Chambers, RN, MSN, FNP-C [facility] Office: [phone]  Clinic Physician: Early  09/02/16 1:42 PM

## 2016-11-20 NOTE — Addendum Note (Signed)
Addended by: Lianne Cure A on: 11/20/2016 08:57 AM   Modules accepted: Orders

## 2017-05-15 DEATH — deceased

## 2017-09-15 ENCOUNTER — Ambulatory Visit: Payer: Medicare PPO | Admitting: Family

## 2017-09-15 ENCOUNTER — Encounter (HOSPITAL_COMMUNITY): Payer: Medicare PPO
